# Patient Record
Sex: Male | Born: 1940 | Race: White | Hispanic: No | Marital: Married | Smoking: Never smoker
Health system: Southern US, Community
[De-identification: ages and names within clinical notes are randomized; demographics above are authoritative.]

## PROBLEM LIST (undated history)

## (undated) DIAGNOSIS — I4891 Unspecified atrial fibrillation: Secondary | ICD-10-CM

## (undated) DIAGNOSIS — I10 Essential (primary) hypertension: Secondary | ICD-10-CM

---

## 2016-09-20 ENCOUNTER — Inpatient Hospital Stay (HOSPITAL_COMMUNITY)
Admission: EM | Admit: 2016-09-20 | Discharge: 2016-09-25 | DRG: 871 | Disposition: A | Discharge status: Home / Self Care | Payer: PRIVATE HEALTH INSURANCE | Attending: Nephrology | Admitting: Nephrology

## 2016-09-20 ENCOUNTER — Encounter (HOSPITAL_COMMUNITY): Payer: Self-pay | Admitting: Emergency Medicine

## 2016-09-20 ENCOUNTER — Emergency Department (HOSPITAL_COMMUNITY): Payer: PRIVATE HEALTH INSURANCE

## 2016-09-20 DIAGNOSIS — I480 Paroxysmal atrial fibrillation: Secondary | ICD-10-CM | POA: Diagnosis present

## 2016-09-20 DIAGNOSIS — N179 Acute kidney failure, unspecified: Secondary | ICD-10-CM

## 2016-09-20 DIAGNOSIS — I10 Essential (primary) hypertension: Secondary | ICD-10-CM | POA: Diagnosis present

## 2016-09-20 DIAGNOSIS — J189 Pneumonia, unspecified organism: Secondary | ICD-10-CM | POA: Diagnosis present

## 2016-09-20 DIAGNOSIS — R531 Weakness: Secondary | ICD-10-CM | POA: Diagnosis present

## 2016-09-20 DIAGNOSIS — E860 Dehydration: Secondary | ICD-10-CM | POA: Diagnosis present

## 2016-09-20 DIAGNOSIS — M6282 Rhabdomyolysis: Secondary | ICD-10-CM | POA: Diagnosis present

## 2016-09-20 DIAGNOSIS — Z79899 Other long term (current) drug therapy: Secondary | ICD-10-CM | POA: Diagnosis not present

## 2016-09-20 DIAGNOSIS — J9601 Acute respiratory failure with hypoxia: Secondary | ICD-10-CM | POA: Diagnosis not present

## 2016-09-20 DIAGNOSIS — Z09 Encounter for follow-up examination after completed treatment for conditions other than malignant neoplasm: Secondary | ICD-10-CM

## 2016-09-20 DIAGNOSIS — W1830XA Fall on same level, unspecified, initial encounter: Secondary | ICD-10-CM | POA: Diagnosis present

## 2016-09-20 DIAGNOSIS — A419 Sepsis, unspecified organism: Principal | ICD-10-CM | POA: Diagnosis present

## 2016-09-20 DIAGNOSIS — J181 Lobar pneumonia, unspecified organism: Secondary | ICD-10-CM | POA: Diagnosis not present

## 2016-09-20 DIAGNOSIS — R0602 Shortness of breath: Secondary | ICD-10-CM

## 2016-09-20 HISTORY — DX: Essential (primary) hypertension: I10

## 2016-09-20 HISTORY — DX: Unspecified atrial fibrillation: I48.91

## 2016-09-20 LAB — CBC WITH DIFFERENTIAL/PLATELET
Basophils Absolute: 0 10*3/uL (ref 0.0–0.1)
Basophils Relative: 0 %
EOS ABS: 0 10*3/uL (ref 0.0–0.7)
Eosinophils Relative: 0 %
HCT: 37.8 % — ABNORMAL LOW (ref 39.0–52.0)
HEMOGLOBIN: 13.5 g/dL (ref 13.0–17.0)
LYMPHS ABS: 0.4 10*3/uL — AB (ref 0.7–4.0)
Lymphocytes Relative: 2 %
MCH: 31.5 pg (ref 26.0–34.0)
MCHC: 35.7 g/dL (ref 30.0–36.0)
MCV: 88.1 fL (ref 78.0–100.0)
MONOS PCT: 5 %
Monocytes Absolute: 1 10*3/uL (ref 0.1–1.0)
NEUTROS ABS: 19.6 10*3/uL — AB (ref 1.7–7.7)
NEUTROS PCT: 93 %
Platelets: 224 10*3/uL (ref 150–400)
RBC: 4.29 MIL/uL (ref 4.22–5.81)
RDW: 13.4 % (ref 11.5–15.5)
WBC: 21 10*3/uL — AB (ref 4.0–10.5)

## 2016-09-20 LAB — COMPREHENSIVE METABOLIC PANEL
ALT: 41 U/L (ref 17–63)
ANION GAP: 8 (ref 5–15)
AST: 77 U/L — ABNORMAL HIGH (ref 15–41)
Albumin: 2.9 g/dL — ABNORMAL LOW (ref 3.5–5.0)
Alkaline Phosphatase: 76 U/L (ref 38–126)
BUN: 44 mg/dL — ABNORMAL HIGH (ref 6–20)
CHLORIDE: 104 mmol/L (ref 101–111)
CO2: 22 mmol/L (ref 22–32)
Calcium: 8.1 mg/dL — ABNORMAL LOW (ref 8.9–10.3)
Creatinine, Ser: 1.3 mg/dL — ABNORMAL HIGH (ref 0.61–1.24)
GFR, EST NON AFRICAN AMERICAN: 52 mL/min — AB (ref >60)
Glucose, Bld: 155 mg/dL — ABNORMAL HIGH (ref 65–99)
POTASSIUM: 4.3 mmol/L (ref 3.5–5.1)
SODIUM: 134 mmol/L — AB (ref 135–145)
Total Bilirubin: 1 mg/dL (ref 0.3–1.2)
Total Protein: 6.3 g/dL — ABNORMAL LOW (ref 6.5–8.1)

## 2016-09-20 LAB — URINALYSIS, ROUTINE W REFLEX MICROSCOPIC
Bilirubin Urine: NEGATIVE
GLUCOSE, UA: NEGATIVE mg/dL
KETONES UR: NEGATIVE mg/dL
Leukocytes, UA: NEGATIVE
NITRITE: NEGATIVE
PROTEIN: 30 mg/dL — AB
Specific Gravity, Urine: 1.019 (ref 1.005–1.030)
pH: 5 (ref 5.0–8.0)

## 2016-09-20 LAB — INFLUENZA PANEL BY PCR (TYPE A & B)
INFLAPCR: NEGATIVE
Influenza B By PCR: NEGATIVE

## 2016-09-20 LAB — I-STAT CG4 LACTIC ACID, ED: LACTIC ACID, VENOUS: 1.71 mmol/L (ref 0.5–1.9)

## 2016-09-20 MED ORDER — AMLODIPINE BESYLATE 5 MG PO TABS
5.0000 mg | ORAL_TABLET | Freq: Every day | ORAL | Status: DC
  Administered 2016-09-20 – 2016-09-24 (×5): 5 mg via ORAL
  Filled 2016-09-20 (×5): qty 1

## 2016-09-20 MED ORDER — SODIUM CHLORIDE 0.9 % IV SOLN
INTRAVENOUS | Status: AC
  Administered 2016-09-20 – 2016-09-21 (×2): via INTRAVENOUS

## 2016-09-20 MED ORDER — SODIUM CHLORIDE 0.9 % IV BOLUS (SEPSIS)
1000.0000 mL | Freq: Once | INTRAVENOUS | Status: AC
  Administered 2016-09-20: 1000 mL via INTRAVENOUS

## 2016-09-20 MED ORDER — DEXTROSE 5 % IV SOLN
1.0000 g | Freq: Once | INTRAVENOUS | Status: AC
  Administered 2016-09-20: 1 g via INTRAVENOUS
  Filled 2016-09-20: qty 10

## 2016-09-20 MED ORDER — AZITHROMYCIN 250 MG PO TABS
500.0000 mg | ORAL_TABLET | ORAL | Status: DC
  Administered 2016-09-21 – 2016-09-25 (×5): 500 mg via ORAL
  Filled 2016-09-20 (×5): qty 2

## 2016-09-20 MED ORDER — ZOLPIDEM TARTRATE 5 MG PO TABS
5.0000 mg | ORAL_TABLET | Freq: Once | ORAL | Status: AC
Start: 2016-09-20 — End: 2016-09-20
  Administered 2016-09-20: 5 mg via ORAL
  Filled 2016-09-20: qty 1

## 2016-09-20 MED ORDER — DEXTROSE 5 % IV SOLN
1.0000 g | INTRAVENOUS | Status: DC
  Administered 2016-09-21 – 2016-09-25 (×5): 1 g via INTRAVENOUS
  Filled 2016-09-20 (×5): qty 10

## 2016-09-20 MED ORDER — APIXABAN 5 MG PO TABS
5.0000 mg | ORAL_TABLET | Freq: Two times a day (BID) | ORAL | Status: DC
  Administered 2016-09-20 – 2016-09-25 (×10): 5 mg via ORAL
  Filled 2016-09-20 (×10): qty 1

## 2016-09-20 MED ORDER — AZITHROMYCIN 500 MG IV SOLR
500.0000 mg | Freq: Once | INTRAVENOUS | Status: AC
  Administered 2016-09-20: 500 mg via INTRAVENOUS
  Filled 2016-09-20: qty 500

## 2016-09-20 NOTE — Progress Notes (Signed)
Pt reports inability to sleep for 2 days and requests something to help sleep.  Pt does not take any medications at home for this nor does pt normally have any trouble sleeping.  Paged on call coverage to notify.

## 2016-09-20 NOTE — ED Provider Notes (Signed)
[facility]-EMERGENCY DEPT Provider Note   CSN: [id number] Arrival date & time: 09/20/16  1341     History   Chief Complaint Chief Complaint  Patient presents with  . Weakness  . Dizziness  . Fever    HPI Randy Tucker is a 75 y.o. male.  Patient with history of atrial fibrillation -- presents with complaint of intermittent fevers, weakness, shivering chills occurring over the past 5 days. Symptoms started 1 day prior [place] Patient went to an outside urgent care and had a negative flu swab 2 days ago. This morning when the patient awoke he felt very weak and fell to the ground. He was unable to get himself off the ground. He had fecal and urinary incontinence. He seems to remember the events. Otherwise no URI symptoms, significant cough, vomiting, diarrhea. No urinary sx. 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 The onset of this condition was acute. The course is constant. Aggravating factors: none. Alleviating factors: none.        Past Medical History:  Diagnosis Date  . Atrial fibrillation (HCC)   . Hypertension     There are no active problems to display for this patient.   History reviewed. No pertinent surgical history.     Home Medications    Prior to Admission medications   Not on File    Family History No family history on file.  Social History Social History  Substance Use Topics  . Smoking status: Never Smoker  . Smokeless tobacco: Never Used  . Alcohol use Not on file     Allergies   Patient has no allergy information on record.   Review of Systems Review of Systems  Constitutional: Positive for chills and fever.  HENT: Negative for rhinorrhea and sore throat.   Eyes: Negative for redness.  Respiratory: Negative for cough.   Cardiovascular: Negative for chest pain.  Gastrointestinal: Negative for abdominal pain, diarrhea, nausea and vomiting.  Genitourinary: Negative for dysuria.    Musculoskeletal: Negative for myalgias.  Skin: Negative for rash.  Neurological: Positive for weakness. Negative for headaches.     Physical Exam Updated Vital Signs BP 131/69 (BP Location: Left Arm)   Pulse 113   Temp 100 F (37.8 C) (Oral)   Resp 18   Ht 5\' 10"  (1.778 m)   Wt 86.2 kg   SpO2 91%   BMI 27.26 kg/m   Physical Exam  Constitutional: He appears well-developed and well-nourished.  HENT:  Head: Normocephalic and atraumatic.  Mouth/Throat: Oropharynx is clear and moist.  Eyes: Conjunctivae are normal. Right eye exhibits no discharge. Left eye exhibits no discharge.  Neck: Normal range of motion. Neck supple.  Cardiovascular: Normal rate, regular rhythm and normal heart sounds.   Pulmonary/Chest: Effort normal. He has rales in the right middle field and the left middle field.  Abdominal: Soft. He exhibits no mass. There is no tenderness. There is no guarding.  Neurological: He is alert.  Skin: Skin is warm and dry.  Psychiatric: He has a normal mood and affect.  Nursing note and vitals reviewed.    ED Treatments / Results  Labs (all labs ordered are listed, but only abnormal results are displayed) Labs Reviewed  COMPREHENSIVE METABOLIC PANEL - Abnormal; Notable for the following:       Result Value   Sodium 134 (*)    Glucose, Bld 155 (*)    BUN 44 (*)    Creatinine, Ser 1.30 (*)  Calcium 8.1 (*)    Total Protein 6.3 (*)    Albumin 2.9 (*)    AST 77 (*)    GFR calc non Af Amer 52 (*)    All other components within normal limits  CBC WITH DIFFERENTIAL/PLATELET - Abnormal; Notable for the following:    WBC 21.0 (*)    HCT 37.8 (*)    Neutro Abs 19.6 (*)    Lymphs Abs 0.4 (*)    All other components within normal limits  CULTURE, BLOOD (ROUTINE X 2)  CULTURE, BLOOD (ROUTINE X 2)  URINE CULTURE  URINALYSIS, ROUTINE W REFLEX MICROSCOPIC  I-STAT CG4 LACTIC ACID, ED    ED ECG REPORT   Date: 09/20/2016  Rate: 109  Rhythm: sinus tachycardia   QRS Axis: normal  Intervals: normal  ST/T Wave abnormalities: normal  Conduction Disutrbances:none  Narrative Interpretation:   Old EKG Reviewed: none available  I have personally reviewed the EKG tracing and agree with the computerized printout as noted.  Radiology Dg Chest 2 View  Result Date: 09/20/2016 CLINICAL DATA:  Chills, weakness for 3 days.  Fever. EXAM: CHEST  2 VIEW COMPARISON:  None. FINDINGS: Mild cardiomegaly. There is consolidation in the right upper lobe compatible with pneumonia. No confluent opacity on the left. No effusion or acute bony abnormality. IMPRESSION: Right upper lobe pneumonia. Followup PA and lateral chest X-ray is recommended in 3-4 weeks following trial of antibiotic therapy to ensure resolution and exclude underlying malignancy. Electronically Signed   By: Charlett NoseKevin  Dover M.D.   On: 09/20/2016 14:43    Procedures Procedures (including critical care time)  Medications Ordered in ED Medications  sodium chloride 0.9 % bolus 1,000 mL (1,000 mLs Intravenous New Bag/Given 09/20/16 1432)  cefTRIAXone (ROCEPHIN) 1 g in dextrose 5 % 50 mL IVPB (not administered)  azithromycin (ZITHROMAX) 500 mg in dextrose 5 % 250 mL IVPB (not administered)     Initial Impression / Assessment and Plan / ED Course  I have reviewed the triage vital signs and the nursing notes.  Pertinent labs & imaging results that were available during my care of the patient were reviewed by me and considered in my medical decision making (see chart for details).  Clinical Course    Patient seen and examined. Work-up initiated. Medications ordered. Fluids ordered. Lactate is normal. Will not give 30cc/kg bolus. Patient appears well. Cultures ordered.   Vital signs reviewed and are as follows: BP 131/69 (BP Location: Left Arm)   Pulse 113   Temp 100 F (37.8 C) (Oral)   Resp 18   Ht 5\' 10"  (1.778 m)   Wt 86.2 kg   SpO2 91%   BMI 27.26 kg/m   3:24 PM Spoke with Dr. Mal MistyNetty who will  admit. Patient discussed previously with Dr. Fredderick PhenixBelfi.   Final Clinical Impressions(s) / ED Diagnoses   Final diagnoses:  Community acquired pneumonia of right upper lobe of lung (HCC)   Admit.   New Prescriptions New Prescriptions   No medications on file     Renne CriglerJoshua Shaniya Tashiro, PA-C 09/20/16 1525    Rolan BuccoMelanie Belfi, MD 09/21/16 [phone]

## 2016-09-20 NOTE — ED Notes (Signed)
Bed: [id number] Expected date:  Expected time:  Means of arrival:  Comments: 75 yo weakness x4 days, fever

## 2016-09-20 NOTE — ED Triage Notes (Addendum)
Patient here from home. Reports that he is from the [place] and is here visiting son. States that he has been weak since Tuesday. Dizziness and fever on and off. Tylenol with no relief. CBG 145

## 2016-09-20 NOTE — H&P (Signed)
History and Physical    Randy AdeDonald Kapusta [id number] DOB: 1941/08/08 DOA: 09/20/2016  PCP: No primary care provider on file. Patient coming from: Home  Chief Complaint: Weakness  HPI: Randy Tucker is a 75 y.o. male with medical history significant of paroxysmal atrial fibrillation and hypertension. Symptoms started 3 days ago with chills. 2 days ago, he went to the urgent care center and was told he likely had a virus. He was told to take Tylenol which has not helped with the symptoms. This morning while trying to go to the restroom, his legs gave out from under him and he was not able to get up. He did not hit his head and did not pass out. At that point, after lying there for a few hours, he was transferred to the emergency for evaluation.  ED Course: Vitals: Tmax of 100 degrees farenheit. Normotensive. On room air. Labs: Sodium 134. Creatinine 1.30. WBC 21K. Urine hemoglobin positive without RBCs Imaging: CXR shows right upper lobe pneumonia Medications/Course: Ceftriaxone and azithromycin  Review of Systems: Review of Systems  Constitutional: Positive for chills and fever (subjective).  Respiratory: Positive for cough. Negative for sputum production, shortness of breath and wheezing.   Cardiovascular: Negative for chest pain.  Gastrointestinal: Negative for abdominal pain, constipation, diarrhea, nausea and vomiting.  All other systems reviewed and are negative.   Past Medical History:  Diagnosis Date  . Atrial fibrillation (HCC)   . Hypertension     History reviewed. No pertinent surgical history.   reports that he has never smoked. He has never used smokeless tobacco. His alcohol and drug histories are not on file.  No Known Allergies  Family History  Problem Relation Age of Onset  . Lung cancer Father     Prior to Admission medications   Medication Sig Start Date End Date Taking? Authorizing Provider  acetaminophen (TYLENOL) 325 MG tablet Take 650 mg by mouth  every 4 (four) hours as needed.   Yes Historical Provider, MD  amLODipine (NORVASC) 5 MG tablet Take 5 mg by mouth daily.   Yes Historical Provider, MD  apixaban (ELIQUIS) 5 MG TABS tablet Take 5 mg by mouth 2 (two) times daily.   Yes Historical Provider, MD    Physical Exam: Vitals:   09/20/16 1349 09/20/16 1506 09/20/16 1720  BP: 131/69 137/78 115/72  Pulse: 113 108 (!) 101  Resp: 18 18 20   Temp: 100 F (37.8 C)  99 F (37.2 C)  TempSrc: Oral  Oral  SpO2: 91% 92% 93%  Weight: 86.2 kg (190 lb)  85.6 kg (188 lb 11.4 oz)  Height: 5\' 10"  (1.778 m)  5\' 10"  (1.778 m)     Constitutional: NAD, calm, comfortable Eyes: PERRL, lids and conjunctivae normal ENMT: Mucous membranes are moist. Posterior pharynx clear of any exudate or lesions. Neck: normal, supple, no masses, no thyromegaly Respiratory: Diffuse crackles with diminished breath sounds in right upper lobe. Normal respiratory effort. No accessory muscle use.  Cardiovascular: Regular rate and rhythm, no murmurs / rubs / gallops. No extremity edema. 2+ pedal pulses. Abdomen: no tenderness, no masses palpated. No hepatosplenomegaly. Bowel sounds positive.  Musculoskeletal: no clubbing / cyanosis. No joint deformity upper and lower extremities. Good ROM, no contractures. Normal muscle tone.  Skin: no rashes, lesions, ulcers. No induration Neurologic: CN 2-12 grossly intact. Sensation intact, DTR normal. Strength 5/5 in all 4.  Psychiatric: Normal judgment and insight. Alert and oriented x 3. Normal mood.    Labs on Admission: I have  personally reviewed following labs and imaging studies  CBC:  Recent Labs Lab 09/20/16 1420  WBC 21.0*  NEUTROABS 19.6*  HGB 13.5  HCT 37.8*  MCV 88.1  PLT 224   Basic Metabolic Panel:  Recent Labs Lab 09/20/16 1420  NA 134*  K 4.3  CL 104  CO2 22  GLUCOSE 155*  BUN 44*  CREATININE 1.30*  CALCIUM 8.1*   GFR: Estimated Creatinine Clearance: 50.7 mL/min (by C-G formula based on SCr  of 1.3 mg/dL (H)). Liver Function Tests:  Recent Labs Lab 09/20/16 1420  AST 77*  ALT 41  ALKPHOS 76  BILITOT 1.0  PROT 6.3*  ALBUMIN 2.9*   No results for input(s): LIPASE, AMYLASE in the last 168 hours. No results for input(s): AMMONIA in the last 168 hours. Coagulation Profile: No results for input(s): INR, PROTIME in the last 168 hours. Cardiac Enzymes: No results for input(s): CKTOTAL, CKMB, CKMBINDEX, TROPONINI in the last 168 hours. BNP (last 3 results) No results for input(s): PROBNP in the last 8760 hours. HbA1C: No results for input(s): HGBA1C in the last 72 hours. CBG: No results for input(s): GLUCAP in the last 168 hours. Lipid Profile: No results for input(s): CHOL, HDL, LDLCALC, TRIG, CHOLHDL, LDLDIRECT in the last 72 hours. Thyroid Function Tests: No results for input(s): TSH, T4TOTAL, FREET4, T3FREE, THYROIDAB in the last 72 hours. Anemia Panel: No results for input(s): VITAMINB12, FOLATE, FERRITIN, TIBC, IRON, RETICCTPCT in the last 72 hours. Urine analysis:    Component Value Date/Time   COLORURINE YELLOW 09/20/2016 1502   APPEARANCEUR HAZY (A) 09/20/2016 1502   LABSPEC 1.019 09/20/2016 1502   PHURINE 5.0 09/20/2016 1502   GLUCOSEU NEGATIVE 09/20/2016 1502   HGBUR LARGE (A) 09/20/2016 1502   BILIRUBINUR NEGATIVE 09/20/2016 1502   KETONESUR NEGATIVE 09/20/2016 1502   PROTEINUR 30 (A) 09/20/2016 1502   NITRITE NEGATIVE 09/20/2016 1502   LEUKOCYTESUR NEGATIVE 09/20/2016 1502    Radiological Exams on Admission: Dg Chest 2 View  Result Date: 09/20/2016 CLINICAL DATA:  Chills, weakness for 3 days.  Fever. EXAM: CHEST  2 VIEW COMPARISON:  None. FINDINGS: Mild cardiomegaly. There is consolidation in the right upper lobe compatible with pneumonia. No confluent opacity on the left. No effusion or acute bony abnormality. IMPRESSION: Right upper lobe pneumonia. Followup PA and lateral chest X-ray is recommended in 3-4 weeks following trial of antibiotic  therapy to ensure resolution and exclude underlying malignancy. Electronically Signed   By: Charlett NoseKevin  Dover M.D.   On: 09/20/2016 14:43    EKG: Independently reviewed. Sinus tachycardia  Assessment/Plan Principal Problem:   Community acquired pneumonia Active Problems:   Paroxysmal atrial fibrillation (HCC)   Essential hypertension  Community acquired pneumonia -ceftriaxone and azithromycin -sputum culture/gram stain -urine strep -blood cultures pending -repeat CXR in 3-4 weeks  Acute kidney injury Likely secondary to dehydration. Patient also down for some time with likely mild rhabdomyolysis -IV fluids -recheck BMP in AM  Paroxysmal atrial fibrillation Rate controlled. -continue Eliquis  Essential hypertension -continue amlodipine   DVT prophylaxis: Eliquis Code Status: Full code Family Communication: Wife at bedside Disposition Plan: Anticipate discharge home in 2 days Consults called: None Admission status: Inpatient, medical floor   Jacquelin Hawkingalph Nettey, MD [facility] Pager [phone]  If 7PM-7AM, please contact night-coverage [email or web address] Password [email or web address]  09/20/2016, 5:46 PM

## 2016-09-21 ENCOUNTER — Inpatient Hospital Stay (HOSPITAL_COMMUNITY): Payer: PRIVATE HEALTH INSURANCE

## 2016-09-21 LAB — BASIC METABOLIC PANEL
Anion gap: 7 (ref 5–15)
BUN: 32 mg/dL — AB (ref 6–20)
CO2: 20 mmol/L — ABNORMAL LOW (ref 22–32)
CREATININE: 1.02 mg/dL (ref 0.61–1.24)
Calcium: 7.5 mg/dL — ABNORMAL LOW (ref 8.9–10.3)
Chloride: 107 mmol/L (ref 101–111)
GFR calc Af Amer: >60 mL/min (ref >60)
Glucose, Bld: 121 mg/dL — ABNORMAL HIGH (ref 65–99)
POTASSIUM: 4 mmol/L (ref 3.5–5.1)
SODIUM: 134 mmol/L — AB (ref 135–145)

## 2016-09-21 LAB — CBC
HEMATOCRIT: 35.7 % — AB (ref 39.0–52.0)
Hemoglobin: 12.2 g/dL — ABNORMAL LOW (ref 13.0–17.0)
MCH: 31 pg (ref 26.0–34.0)
MCHC: 34.2 g/dL (ref 30.0–36.0)
MCV: 90.6 fL (ref 78.0–100.0)
PLATELETS: 229 10*3/uL (ref 150–400)
RBC: 3.94 MIL/uL — ABNORMAL LOW (ref 4.22–5.81)
RDW: 13.9 % (ref 11.5–15.5)
WBC: 18.5 10*3/uL — AB (ref 4.0–10.5)

## 2016-09-21 LAB — URINE CULTURE: Culture: NO GROWTH

## 2016-09-21 LAB — EXPECTORATED SPUTUM ASSESSMENT W GRAM STAIN, RFLX TO RESP C

## 2016-09-21 LAB — HIV ANTIBODY (ROUTINE TESTING W REFLEX): HIV Screen 4th Generation wRfx: NONREACTIVE

## 2016-09-21 LAB — STREP PNEUMONIAE URINARY ANTIGEN: STREP PNEUMO URINARY ANTIGEN: NEGATIVE

## 2016-09-21 LAB — LACTIC ACID, PLASMA: Lactic Acid, Venous: 0.8 mmol/L (ref 0.5–1.9)

## 2016-09-21 MED ORDER — SODIUM CHLORIDE 0.9 % IV BOLUS (SEPSIS): 500.0000 mL | Freq: Once | INTRAVENOUS | Status: DC

## 2016-09-21 MED ORDER — ACETAMINOPHEN 325 MG PO TABS
650.0000 mg | ORAL_TABLET | ORAL | Status: DC | PRN
  Administered 2016-09-21 – 2016-09-23 (×2): 650 mg via ORAL
  Filled 2016-09-21: qty 2

## 2016-09-21 MED ORDER — IBUPROFEN 200 MG PO TABS: 400.0000 mg | ORAL_TABLET | Freq: Once | ORAL | Status: DC

## 2016-09-21 MED ORDER — SODIUM CHLORIDE 0.9 % IV BOLUS (SEPSIS)
1000.0000 mL | Freq: Once | INTRAVENOUS | Status: AC
  Administered 2016-09-21: 1000 mL via INTRAVENOUS

## 2016-09-21 MED ORDER — IPRATROPIUM-ALBUTEROL 0.5-2.5 (3) MG/3ML IN SOLN
3.0000 mL | Freq: Three times a day (TID) | RESPIRATORY_TRACT | Status: DC
Start: 2016-09-21 — End: 2016-09-25
  Administered 2016-09-22 – 2016-09-25 (×11): 3 mL via RESPIRATORY_TRACT
  Filled 2016-09-21 (×11): qty 3

## 2016-09-21 MED ORDER — LORAZEPAM 1 MG PO TABS
1.0000 mg | ORAL_TABLET | Freq: Two times a day (BID) | ORAL | Status: DC | PRN
  Administered 2016-09-21 – 2016-09-24 (×4): 1 mg via ORAL
  Filled 2016-09-21 (×4): qty 1

## 2016-09-21 MED ORDER — IPRATROPIUM-ALBUTEROL 0.5-2.5 (3) MG/3ML IN SOLN
3.0000 mL | RESPIRATORY_TRACT | Status: DC | PRN
  Administered 2016-09-21 – 2016-09-25 (×2): 3 mL via RESPIRATORY_TRACT
  Filled 2016-09-21 (×2): qty 3

## 2016-09-21 MED ORDER — SODIUM CHLORIDE 0.9 % IV SOLN
INTRAVENOUS | Status: AC
  Administered 2016-09-21: 19:00:00 via INTRAVENOUS

## 2016-09-21 NOTE — Progress Notes (Signed)
Report called and given to RN on 4W.

## 2016-09-21 NOTE — Progress Notes (Signed)
Mid level paged concerning patients respirations of 40, temp 102.1 Axillary, HR 95, b/p 122/70.  Rapid response team called also to evaluate patients condition and if he would need a higher level of care to be monitored more closely. Will continue to monitor.

## 2016-09-21 NOTE — Progress Notes (Signed)
Wrong time entered for vital signs. -MR Nt1 3W

## 2016-09-21 NOTE — Progress Notes (Signed)
PT demonstrated verbal and hands on understanding of Flutter device. 

## 2016-09-21 NOTE — Progress Notes (Signed)
Dr Gwenlyn PerkingMadera paged concerning increased respirations of 36. Patient alert and oriented  X 4, no respiratory distress, pulse 98. New orders to be placed for chest xray. Will continue to monitor.

## 2016-09-21 NOTE — Progress Notes (Signed)
[facility] PROGRESS NOTE  Randy AdeDonald Tucker [id number] DOB: 07-Dec-1940 DOA: 09/20/2016 PCP: No primary care provider on file.  Interim summary and HPI 75 y.o. male with medical history significant of paroxysmal atrial fibrillation and hypertension. Symptoms started 3 days ago with chills. 2 days ago, he went to the urgent care center and was told he likely had a virus. He was told to take Tylenol which has not helped with the symptoms. This morning while trying to go to the restroom, his legs gave out from under him and he was not able to get up. He did not hit his head and did not pass out. At that point, after lying there for a few hours, he was transferred to the emergency for evaluation.  Assessment/Plan: 1-SEpsis due to CAP: patient febrile, tachycardic, with elevated RR and AKI -patient with CURB 65 score > 2 -slowly improving and feeling better -no febrile now, but still SOB and with tachypnea (RR in 30's) -will continue IV abx's, flutter valve/IS and nebulizer treatment -will follow cx's as per PNA protocol  -continue supportive care  2-AKI: in presence of sepsis and dehydration  -will continue IVF's -Cr has trended down -will minimize nephrotoxic agents  3-essential HTN -will continue amlodipine -BP is stable  4-Paroxysmal A. Fib -CHADsVASC score 3 -rate controlled currently -will continue Eliquis for secondary prevention   Code Status: Full Family Communication: no family at bedside  Disposition Plan: continue IV antibiotics, gentle IVF's, follow BMET and WBC's trend; continue nebulizer therapy and flutter valve/IS.   Consultants:  None   Procedures:  See below for x-ray reports   Antibiotics:  Rocephin and Zithromax 12/16  HPI/Subjective: Afebrile, still SOB with exertion and with positive tachypnea. No CP.  Objective: Vitals:   09/21/16 0415 09/21/16 0943  BP: (!) 110/55   Pulse: 80   Resp: 16 (!) 32  Temp: 98.2 F (36.8 C)      Intake/Output Summary (Last 24 hours) at 09/21/16 1017 Last data filed at 09/21/16 0415  Gross per 24 hour  Intake          1496.67 ml  Output              450 ml  Net          1046.67 ml   Filed Weights   09/20/16 1349 09/20/16 1720  Weight: 86.2 kg (190 lb) 85.6 kg (188 lb 11.4 oz)    Exam:   General:  Afebrile currently, feeling slightly better; still SOB with minimal exertion and with elevated RR. No nausea, no vomiting.  Cardiovascular: S1 and s2, no rubs or gallops   Respiratory: scattered rhonchi, decrease air movement and with positive tachypnea  Abdomen: soft, NT, positive BS  Musculoskeletal: no edema or cyanosis   Data Reviewed: Basic Metabolic Panel:  Recent Labs Lab 09/20/16 1420 09/21/16 0346  NA 134* 134*  K 4.3 4.0  CL 104 107  CO2 22 20*  GLUCOSE 155* 121*  BUN 44* 32*  CREATININE 1.30* 1.02  CALCIUM 8.1* 7.5*   Liver Function Tests:  Recent Labs Lab 09/20/16 1420  AST 77*  ALT 41  ALKPHOS 76  BILITOT 1.0  PROT 6.3*  ALBUMIN 2.9*   CBC:  Recent Labs Lab 09/20/16 1420 09/21/16 0346  WBC 21.0* 18.5*  NEUTROABS 19.6*  --   HGB 13.5 12.2*  HCT 37.8* 35.7*  MCV 88.1 90.6  PLT 224 229   Studies: Dg Chest 2 View  Result Date: 09/20/2016 CLINICAL DATA:  Chills, weakness for 3 days.  Fever. EXAM: CHEST  2 VIEW COMPARISON:  None. FINDINGS: Mild cardiomegaly. There is consolidation in the right upper lobe compatible with pneumonia. No confluent opacity on the left. No effusion or acute bony abnormality. IMPRESSION: Right upper lobe pneumonia. Followup PA and lateral chest X-ray is recommended in 3-4 weeks following trial of antibiotic therapy to ensure resolution and exclude underlying malignancy. Electronically Signed   By: Charlett NoseKevin  Dover M.D.   On: 09/20/2016 14:43    Scheduled Meds: . amLODipine  5 mg Oral Daily  . apixaban  5 mg Oral BID  . azithromycin  500 mg Oral Q24H  . cefTRIAXone (ROCEPHIN)  IV  1 g Intravenous Q24H    Continuous Infusions: . sodium chloride 50 mL/hr at 09/21/16 [id number]    Principal Problem:   Community acquired pneumonia Active Problems:   Paroxysmal atrial fibrillation (HCC)   Essential hypertension   Acute kidney injury (HCC)    Time spent: 25 minutes    Vassie LollMadera, Champayne Kocian  [facility] Pager [phone]. If 7PM-7AM, please contact night-coverage at [email or web address], password [email or web address] 09/21/2016, 10:17 AM  LOS: 1 day

## 2016-09-21 NOTE — Progress Notes (Addendum)
Rapid Response Event Note  Overview:Patient resting in bed, color normal, alert and oriented x4; no respiratory distress noted with the exception of increased respirations.  Patient denies SOB currently; reports feeling occasionally SOB with exertion,  respirations ranging from 22-low 40's.  Slightly tachy with temp 102.6 axillary.  BP 136/69; Pulse 102, R 32 currently, 95% 4l via [place]      Initial Focused Assessment:Resting in bed, a/ox4; increased respirations with no acute sign of respiratory distress, patient denies SOB.  Elevated temp 102.6 axillary; slightly tachy at 102; BP normal 136/69.  Patient starting trending with increased respirations around 0935 this am.  Respirations ranging from 22-low 40's.     Interventions:O2 was applied by primary RN, NS bolus given, order for breathing treatment and tylenol given.    Plan of Care (if not transferred):awaiting evaluation by NP;   Event Summary:2040-patients HR increased to 130; obtaining 12 lead EKG    at  2049- HR 102; BP 131/70; 96% 4L via [place]; R 30    at          [employer], Donnetta HailLaurie Leigh

## 2016-09-22 DIAGNOSIS — J9621 Acute and chronic respiratory failure with hypoxia: Secondary | ICD-10-CM

## 2016-09-22 LAB — BASIC METABOLIC PANEL
Anion gap: 7 (ref 5–15)
BUN: 29 mg/dL — ABNORMAL HIGH (ref 6–20)
CO2: 22 mmol/L (ref 22–32)
Calcium: 7.4 mg/dL — ABNORMAL LOW (ref 8.9–10.3)
Chloride: 110 mmol/L (ref 101–111)
Creatinine, Ser: 0.93 mg/dL (ref 0.61–1.24)
GFR calc Af Amer: >60 mL/min (ref >60)
GFR calc non Af Amer: >60 mL/min (ref >60)
Glucose, Bld: 137 mg/dL — ABNORMAL HIGH (ref 65–99)
Potassium: 4 mmol/L (ref 3.5–5.1)
Sodium: 139 mmol/L (ref 135–145)

## 2016-09-22 LAB — CBC
HCT: 34 % — ABNORMAL LOW (ref 39.0–52.0)
Hemoglobin: 11.4 g/dL — ABNORMAL LOW (ref 13.0–17.0)
MCH: 30.8 pg (ref 26.0–34.0)
MCHC: 33.5 g/dL (ref 30.0–36.0)
MCV: 91.9 fL (ref 78.0–100.0)
Platelets: 245 10*3/uL (ref 150–400)
RBC: 3.7 MIL/uL — ABNORMAL LOW (ref 4.22–5.81)
RDW: 14.1 % (ref 11.5–15.5)
WBC: 9.7 10*3/uL (ref 4.0–10.5)

## 2016-09-22 MED ORDER — BUDESONIDE 0.25 MG/2ML IN SUSP
0.2500 mg | Freq: Two times a day (BID) | RESPIRATORY_TRACT | Status: DC
  Administered 2016-09-22 – 2016-09-25 (×7): 0.25 mg via RESPIRATORY_TRACT
  Filled 2016-09-22 (×7): qty 2

## 2016-09-22 NOTE — Progress Notes (Signed)
[facility] PROGRESS NOTE  Randy Tucker ZOX:[id number] DOB: [birth year] DOA: 09/20/2016 PCP: No primary care provider on file.  Interim summary and HPI 75 y.o. male with medical history significant of paroxysmal atrial fibrillation and hypertension. Symptoms started 3 days ago with chills. 2 days ago, he went to the urgent care center and was told he likely had a virus. He was told to take Tylenol which has not helped with the symptoms. This morning while trying to go to the restroom, his legs gave out from under him and he was not able to get up. He did not hit his head and did not pass out. At that point, after lying there for a few hours, he was transferred to the emergency for evaluation.  Assessment/Plan: 1-Sepsis due to CAP: patient febrile, tachycardic, with elevated RR and AKI. Also requiring Oxygen supplementation. -patient with CURB 65 score > 2 -slowly improving and feeling better -no febrile now, but still SOB and with tachypnea (RR in 30's) -will continue IV abx's, flutter valve/IS and nebulizer treatment; adding pulmicort BID -will follow cx's as per PNA protocol  -Continue supportive care  2-acute resp failure with hypoxia -due to #1 -continue treatment as above -will follow clinical response and weaned O2 supplementation as tolerated  3-AKI: in presence of sepsis and dehydration  -Will continue IVF's -Cr has trended down -Will minimize nephrotoxic agents  4-essential HTN -will continue amlodipine -BP is stable  5-Paroxysmal A. Fib -CHADsVASC score 3 -rate controlled currently -will continue Eliquis for secondary prevention  -observing him in telemetry; has remained sinus rhythm   Code Status: Full Family Communication: no family at bedside  Disposition Plan: continue IV antibiotics, follow BMET and WBC's trend; continue nebulizer therapy and flutter valve/IS.   Consultants:  None   Procedures:  See below for x-ray reports    Antibiotics:  Rocephin and Zithromax 12/16  HPI/Subjective: Afebrile, still SOB with exertion, some difficulty speaking in full sentences and with positive tachypnea. No CP and overall improved currently. Rough night with episode of acute resp failure with hypoxia  Objective: Vitals:   09/21/16 2348 09/22/16 0446  BP: (!) 113/59 133/75  Pulse: 80 90  Resp: (!) 22 (!) 39  Temp: 98.6 F (37 C) 97.7 F (36.5 C)    Intake/Output Summary (Last 24 hours) at 09/22/16 1510 Last data filed at 09/22/16 1221  Gross per 24 hour  Intake              600 ml  Output              400 ml  Net              200 ml   Filed Weights   09/20/16 1349 09/20/16 1720 09/21/16 2348  Weight: 86.2 kg (190 lb) 85.6 kg (188 lb 11.4 oz) 88 kg (193 lb 14.4 oz)    Exam:   General:  Afebrile currently, feeling better today; but still SOB and with busy night experiencing acute resp failure and hypoxia. Repeat CXR demonstrated just PNA (now with some infiltrates on the left as well); no vascular congestion or pulmonary edema. Patient slightly anxious, otherwise in no distress.   Cardiovascular: S1 and s2, no rubs or gallops   Respiratory: scattered rhonchi, decrease air movement and with positive tachypnea  Abdomen: soft, NT, positive BS  Musculoskeletal: no edema or cyanosis   Data Reviewed: Basic Metabolic Panel:  Recent Labs Lab 09/20/16 1420 09/21/16 0346 09/22/16 0338  NA 134* 134* 139  K 4.3 4.0 4.0  CL 104 107 110  CO2 22 20* 22  GLUCOSE 155* 121* 137*  BUN 44* 32* 29*  CREATININE 1.30* 1.02 0.93  CALCIUM 8.1* 7.5* 7.4*   Liver Function Tests:  Recent Labs Lab 09/20/16 1420  AST 77*  ALT 41  ALKPHOS 76  BILITOT 1.0  PROT 6.3*  ALBUMIN 2.9*   CBC:  Recent Labs Lab 09/20/16 1420 09/21/16 0346 09/22/16 0338  WBC 21.0* 18.5* 9.7  NEUTROABS 19.6*  --   --   HGB 13.5 12.2* 11.4*  HCT 37.8* 35.7* 34.0*  MCV 88.1 90.6 91.9  PLT 224 229 245   Studies: Dg Chest 2  View  Result Date: 09/21/2016 CLINICAL DATA:  SOB. Hx of HTN. EXAM: CHEST  2 VIEW COMPARISON:  09/20/2016 FINDINGS: Heart size is normal. Dense opacity identified in the right upper lobe. There are bilateral pleural effusions. Mild infiltrate identified within the left lower lobe. IMPRESSION: Persistent right upper lobe infiltrate. Question early left lower lobe infiltrate. Electronically Signed   By: Norva PavlovElizabeth  Brown M.D.   On: 09/21/2016 14:07    Scheduled Meds: . amLODipine  5 mg Oral Daily  . apixaban  5 mg Oral BID  . azithromycin  500 mg Oral Q24H  . budesonide (PULMICORT) nebulizer solution  0.25 mg Nebulization BID  . cefTRIAXone (ROCEPHIN)  IV  1 g Intravenous Q24H  . ipratropium-albuterol  3 mL Nebulization TID   Continuous Infusions:   Principal Problem:   Community acquired pneumonia Active Problems:   Paroxysmal atrial fibrillation (HCC)   Essential hypertension   Acute kidney injury (HCC)    Time spent: 25 minutes    Vassie LollMadera, Atthew Coutant  [facility] Pager [phone]. If 7PM-7AM, please contact night-coverage at [email or web address], password [email or web address] 09/22/2016, 3:10 PM  LOS: 2 days

## 2016-09-22 NOTE — Progress Notes (Signed)
Shift event note: Notified just after shift change pt w/ increased WOB w/ RR in 30's and  temp of > 102. 02 sats remain WNL on 2L [place]. Episode similar to previous episode earlier today. CXR at that time (1403) revealed new LLL infiltrate. Pt was given Ativan for anxiety which did not seem to help per RN. RR RN was paged and responded to bedside. Orders placed for 500 cc IVFB and Tylenol. RR RN reports pt somewhat improved after scheduled neb. 02 sats WNL on 2 L [place] but will drop to high 80's when 02 removed. EKG obtained revealed ST w/ rate of 102 but w/o acute changes. RR RN is concerned regarding no telemetry. Pt w/ h/o a-fib and in light of tenuous respiratory status though currently does not require SDU LOC would benefit from continuous cardiac and 02 monitoring. At bedside pt noted resting in NAD. RR 24-28 w/ 02 sats 95% on 2L [place]. BBS diminished R>L. Pt reports he is feeling better.  Assessment/Plan: 1. Tachypnea: Improved after scheduled neb. 02 sats WNL on [place] but will trend down w/o 02. CXR from today slightly worsened. Discussed pt w/ Dr Clyde LundborgNiu. Will repeat 500 cc NS IVFB. Given h/o a-fib (though remains in ST at this time), and tenuous respiratory status will transfer to telemetry for closer monitoring. Low threshold to transfer to SDU if pt deteriorates further. Will continue to monitor closely on telemetry.  Leanne ChangKatherine P. Darlynn Ricco, NP-C [facility] Pager [phone]

## 2016-09-23 LAB — CULTURE, RESPIRATORY W GRAM STAIN: Culture: NORMAL

## 2016-09-23 LAB — CULTURE, RESPIRATORY

## 2016-09-23 NOTE — Progress Notes (Signed)
[facility] PROGRESS NOTE  Randy Tucker [place]berry ZOX:[id number] DOB: [birth year] DOA: 09/20/2016 PCP: No primary care provider on file.  Interim summary and HPI 75 y.o. male with medical history significant of paroxysmal atrial fibrillation and hypertension. Symptoms started 3 days ago with chills. 2 days ago, he went to the urgent care center and was told he likely had a virus. He was told to take Tylenol which has not helped with the symptoms. This morning while trying to go to the restroom, his legs gave out from under him and he was not able to get up. He did not hit his head and did not pass out. At that point, after lying there for a few hours, he was transferred to the emergency for evaluation.  Assessment/Plan: 1-Sepsis due to CAP: patient febrile, tachycardic, with elevated RR and AKI. Also requiring Oxygen supplementation. -patient with CURB 65 score > 2 -slowly improving and feeling much better, even still requiring oxygen supplementation and experiencing tachypnea with minimal exertion. -no febrile now, for over 24 hours and WBC's WNL -will continue IV abx's for another 24 hours, continue flutter valve/IS and nebulizer/pulmicort BID therapy -will follow cx's as per PNA protocol  -Continue supportive care; patient improving  2-acute resp failure with hypoxia -due to #1 -continue treatment as above -will follow clinical response and weaned O2 supplementation as tolerated  3-AKI: in presence of sepsis and dehydration  -Will change IVF's to saline lock -Cr has trended down and is WNL now -Will minimize nephrotoxic agents -will follow trend  4-essential HTN -will continue amlodipine -BP is stable  5-Paroxysmal A. Fib -CHADsVASC score 3 -rate controlled currently -will continue Eliquis for secondary prevention  -observing him in telemetry; has remained sinus rhythm   Code Status: Full Family Communication: no family at bedside  Disposition Plan: continue IV antibiotics,  follow BMET and WBC's trend; continue nebulizer therapy and flutter valve/IS. Will attempt to weaned oxygen and if he remains stable can switch abx's to PO. Hopefully home in 1-2 days, once breathing further stabilizes.   Consultants:  None   Procedures:  See below for x-ray reports   Antibiotics:  Rocephin and Zithromax 12/16  HPI/Subjective: Afebrile, still SOB with exertion, some wheezing and positive tachypnea. Still requiring oxygen supplementation. Patient now afebrile for > 24 hours.  Objective: Vitals:   09/23/16 1639 09/23/16 2126  BP: 134/63 124/70  Pulse: 86 74  Resp: (!) 23 20  Temp: 98.4 F (36.9 C) 99.4 F (37.4 C)    Intake/Output Summary (Last 24 hours) at 09/23/16 2227 Last data filed at 09/23/16 2134  Gross per 24 hour  Intake              360 ml  Output             1650 ml  Net            -1290 ml   Filed Weights   09/20/16 1349 09/20/16 1720 09/21/16 2348  Weight: 86.2 kg (190 lb) 85.6 kg (188 lb 11.4 oz) 88 kg (193 lb 14.4 oz)    Exam:   General:  Afebrile currently, feeling better today overall. Still SOB and with needs for oxygen supplementation. No CP. Less tachypneic.  Cardiovascular: S1 and s2, no rubs or gallops   Respiratory: scattered rhonchi, mild exp wheezing and fair air movement; positive tachypnea  Abdomen: soft, NT, positive BS  Musculoskeletal: no edema or cyanosis   Data Reviewed: Basic Metabolic Panel:  Recent Labs Lab 09/20/16 1420 09/21/16  0346 09/22/16 0338  NA 134* 134* 139  K 4.3 4.0 4.0  CL 104 107 110  CO2 22 20* 22  GLUCOSE 155* 121* 137*  BUN 44* 32* 29*  CREATININE 1.30* 1.02 0.93  CALCIUM 8.1* 7.5* 7.4*   Liver Function Tests:  Recent Labs Lab 09/20/16 1420  AST 77*  ALT 41  ALKPHOS 76  BILITOT 1.0  PROT 6.3*  ALBUMIN 2.9*   CBC:  Recent Labs Lab 09/20/16 1420 09/21/16 0346 09/22/16 0338  WBC 21.0* 18.5* 9.7  NEUTROABS 19.6*  --   --   HGB 13.5 12.2* 11.4*  HCT 37.8* 35.7*  34.0*  MCV 88.1 90.6 91.9  PLT 224 229 245   Studies: No results found.  Scheduled Meds: . amLODipine  5 mg Oral Daily  . apixaban  5 mg Oral BID  . azithromycin  500 mg Oral Q24H  . budesonide (PULMICORT) nebulizer solution  0.25 mg Nebulization BID  . cefTRIAXone (ROCEPHIN)  IV  1 g Intravenous Q24H  . ipratropium-albuterol  3 mL Nebulization TID   Continuous Infusions:   Principal Problem:   Community acquired pneumonia Active Problems:   Paroxysmal atrial fibrillation (HCC)   Essential hypertension   Acute kidney injury (HCC)    Time spent: 25 minutes    Vassie LollMadera, Dalia Jollie  [facility] Pager [phone]. If 7PM-7AM, please contact night-coverage at [email or web address], password [email or web address] 09/23/2016, 10:27 PM  LOS: 3 days

## 2016-09-23 NOTE — Progress Notes (Signed)
Pt ambulated in hallway on Room Air. Pt's O2 sats on room air 88-90%. Pt continues with dyspnea on exertion but tolerated this walk better than previous walk. Pt reports feeling "a little stronger than last time". Back to room and will continue to attempt weaning oxygen and Pt encouraged to use Incentive Spirometry

## 2016-09-23 NOTE — Progress Notes (Addendum)
Pt ambulated in the hallway on Room Air. Pt able to ambulate from room to the Nurses Station. Pt midly short of breath and states " My legs feel weak" On Room Air O2 Sat 86-88%. Back to room and placed on O2 2l with O2 sats 93-94%.

## 2016-09-24 DIAGNOSIS — I10 Essential (primary) hypertension: Secondary | ICD-10-CM

## 2016-09-24 DIAGNOSIS — J9601 Acute respiratory failure with hypoxia: Secondary | ICD-10-CM

## 2016-09-24 DIAGNOSIS — I480 Paroxysmal atrial fibrillation: Secondary | ICD-10-CM

## 2016-09-24 LAB — CBC
HEMATOCRIT: 39 % (ref 39.0–52.0)
Hemoglobin: 13.1 g/dL (ref 13.0–17.0)
MCH: 30.7 pg (ref 26.0–34.0)
MCHC: 33.6 g/dL (ref 30.0–36.0)
MCV: 91.3 fL (ref 78.0–100.0)
Platelets: 362 10*3/uL (ref 150–400)
RBC: 4.27 MIL/uL (ref 4.22–5.81)
RDW: 14.1 % (ref 11.5–15.5)
WBC: 10.3 10*3/uL (ref 4.0–10.5)

## 2016-09-24 LAB — BASIC METABOLIC PANEL
Anion gap: 6 (ref 5–15)
BUN: 29 mg/dL — AB (ref 6–20)
CALCIUM: 8.2 mg/dL — AB (ref 8.9–10.3)
CO2: 23 mmol/L (ref 22–32)
CREATININE: 0.96 mg/dL (ref 0.61–1.24)
Chloride: 108 mmol/L (ref 101–111)
GFR calc non Af Amer: >60 mL/min (ref >60)
Glucose, Bld: 112 mg/dL — ABNORMAL HIGH (ref 65–99)
Potassium: 4.7 mmol/L (ref 3.5–5.1)
Sodium: 137 mmol/L (ref 135–145)

## 2016-09-24 NOTE — Progress Notes (Signed)
Patient ambulated on hallway from room to the nurses station and 02 sat drop to 85% RA

## 2016-09-24 NOTE — Plan of Care (Signed)
Problem: Safety: Goal: Ability to remain free from injury will improve Outcome: Progressing Pt ambulating with one assist, slightly unsteady.    Problem: Pain Managment: Goal: General experience of comfort will improve Outcome: Completed/Met Date Met: 09/24/16 Denies pain.   Problem: Physical Regulation: Goal: Ability to maintain clinical measurements within normal limits will improve Outcome: Progressing BP 129/61 (BP Location: Right Arm)   Pulse 70   Temp 97.7 F (36.5 C) (Oral)   Resp 20   Ht _0  (1.778 m)   Wt 88 kg (193 lb 14.4 oz)   SpO2 91%   BMI 27.82 kg/m    Problem: Skin Integrity: Goal: Risk for impaired skin integrity will decrease Outcome: Completed/Met Date Met: 09/24/16 Pt with good mobility.  Braden score >18.  Problem: Tissue Perfusion: Goal: Risk factors for ineffective tissue perfusion will decrease Outcome: Completed/Met Date Met: 09/24/16 Not on IVF, good PO intake.    Problem: Bowel/Gastric: Goal: Will not experience complications related to bowel motility Continue to monitor.  Last BM 09/22/16.

## 2016-09-24 NOTE — Progress Notes (Signed)
Pt ambulated from room to nurse's station on room air.  SpO2 92-99%.  Weaned to room air.  Denies SOB.  Respirations regular and unlabored.  Will continue with plan of care.

## 2016-09-24 NOTE — Progress Notes (Signed)
PROGRESS NOTE    Randy Tucker  XLK:[id number] DOB: 1941-08-08 DOA: 09/20/2016 PCP: No primary care provider on file.   Brief Narrative: 75 y.o.malewith medical history significant of paroxysmal atrial fibrillation and hypertension. He went to the urgent care center for chills and was told he likely had a virus. He was told to take Tylenol which has not helped with the symptoms. He felt weak and had pre-syncopal episode. He did not hit his head and did not pass out. At that point, after lying there for a few hours, he was transferred to the emergency for evaluation  Assessment & Plan: # Sepsis due to committee acquired pneumonia on admission: -Patient is clinically improving. He is afebrile now. Try to wean oxygen slowly. Requiring 2 L of oxygen today morning. Continue ceftriaxone and azithromycin. May be able to switch to oral Levaquin by tomorrow. -Discussed with the patient, his wife, nursing staff and care management team regarding plan of care. [place] He needs hospital discharge follow-up before flying back to [place].  #Acute respiratory failure with hypoxia: Requiring 2 L of oxygen. Try to wean off to room air and slowly. Continue bronchodilators and respiratory treatment.  #Acute kidney injury likely in the setting of sepsis and dehydration: Serum creatinine level improved.  #Essential hypertension: Continue Norvasc. Monitor blood pressure.  #Paroxysmal atrial fibrillation: Heart rate is controlled. Currently on systemic anticoagulation. Continue to monitor.  Principal Problem:   Community acquired pneumonia Active Problems:   Paroxysmal atrial fibrillation (HCC)   Essential hypertension   Acute kidney injury (HCC)  DVT prophylaxis: Systemic anticoagulation Code Status: Full code Family Communication: Discussed with the patient's wife over phone Disposition Plan: Likely discharge home tomorrow with oral antibiotics.    Consultants:    None  Procedures: None Antimicrobials: Ceftriaxone and azithromycin.  Subjective: Patient was seen and examined at bedside. Patient reported feeling much better. Denied shortness of breath, cough, chest pain. No fever, chills, nausea, vomiting, dizziness or lightheadedness. Requiring 2 L of oxygen.   Objective: Vitals:   09/24/16 0528 09/24/16 0859 09/24/16 1430 09/24/16 1452  BP: 125/77  129/61   Pulse: 65  70   Resp: 20  20   Temp: 97.7 F (36.5 C)     TempSrc: Oral     SpO2: 98% 98% 92% 91%  Weight:      Height:        Intake/Output Summary (Last 24 hours) at 09/24/16 1828 Last data filed at 09/24/16 1503  Gross per 24 hour  Intake              220 ml  Output             1000 ml  Net             -780 ml   Filed Weights   09/20/16 1349 09/20/16 1720 09/21/16 2348  Weight: 86.2 kg (190 lb) 85.6 kg (188 lb 11.4 oz) 88 kg (193 lb 14.4 oz)    Examination:  General exam: Appears calm and comfortable  Respiratory system: Clear to auscultation bilateral, Respiratory effort normal. No wheezing or crackle Cardiovascular system: S1 & S2 heard, RRR.  No pedal edema. Gastrointestinal system: Abdomen is nondistended, soft and nontender. Normal bowel sounds heard. Central nervous system: Alert and oriented. No focal neurological deficits. Extremities: Symmetric 5 x 5 power. Skin: No rashes, lesions or ulcers Psychiatry: Judgement and insight appear normal. Mood & affect appropriate.     Data Reviewed: I have  personally reviewed following labs and imaging studies  CBC:  Recent Labs Lab 09/20/16 1420 09/21/16 0346 09/22/16 0338 09/24/16 0526  WBC 21.0* 18.5* 9.7 10.3  NEUTROABS 19.6*  --   --   --   HGB 13.5 12.2* 11.4* 13.1  HCT 37.8* 35.7* 34.0* 39.0  MCV 88.1 90.6 91.9 91.3  PLT 224 229 245 362   Basic Metabolic Panel:  Recent Labs Lab 09/20/16 1420 09/21/16 0346 09/22/16 0338 09/24/16 0526  NA 134* 134* 139 137  K 4.3 4.0 4.0 4.7  CL 104 107 110 108   CO2 22 20* 22 23  GLUCOSE 155* 121* 137* 112*  BUN 44* 32* 29* 29*  CREATININE 1.30* 1.02 0.93 0.96  CALCIUM 8.1* 7.5* 7.4* 8.2*   GFR: Estimated Creatinine Clearance: 74.3 mL/min (by C-G formula based on SCr of 0.96 mg/dL). Liver Function Tests:  Recent Labs Lab 09/20/16 1420  AST 77*  ALT 41  ALKPHOS 76  BILITOT 1.0  PROT 6.3*  ALBUMIN 2.9*   No results for input(s): LIPASE, AMYLASE in the last 168 hours. No results for input(s): AMMONIA in the last 168 hours. Coagulation Profile: No results for input(s): INR, PROTIME in the last 168 hours. Cardiac Enzymes: No results for input(s): CKTOTAL, CKMB, CKMBINDEX, TROPONINI in the last 168 hours. BNP (last 3 results) No results for input(s): PROBNP in the last 8760 hours. HbA1C: No results for input(s): HGBA1C in the last 72 hours. CBG: No results for input(s): GLUCAP in the last 168 hours. Lipid Profile: No results for input(s): CHOL, HDL, LDLCALC, TRIG, CHOLHDL, LDLDIRECT in the last 72 hours. Thyroid Function Tests: No results for input(s): TSH, T4TOTAL, FREET4, T3FREE, THYROIDAB in the last 72 hours. Anemia Panel: No results for input(s): VITAMINB12, FOLATE, FERRITIN, TIBC, IRON, RETICCTPCT in the last 72 hours. Sepsis Labs:  Recent Labs Lab 09/20/16 1433 09/21/16 2216  LATICACIDVEN 1.71 0.8    Recent Results (from the past 240 hour(s))  Blood Culture (routine x 2)     Status: None (Preliminary result)   Collection Time: 09/20/16  2:20 PM  Result Value Ref Range Status   Specimen Description BLOOD LEFT ANTECUBITAL  Final   Special Requests BOTTLES DRAWN AEROBIC AND ANAEROBIC 5 CC EACH  Final   Culture   Final    NO GROWTH 4 DAYS Performed at [facility]    Report Status PENDING  Incomplete  Blood Culture (routine x 2)     Status: None (Preliminary result)   Collection Time: 09/20/16  2:25 PM  Result Value Ref Range Status   Specimen Description BLOOD RIGHT ARM  Final   Special Requests BOTTLES  DRAWN AEROBIC AND ANAEROBIC 5 CC EACH  Final   Culture   Final    NO GROWTH 4 DAYS Performed at [facility]    Report Status PENDING  Incomplete  Urine culture     Status: None   Collection Time: 09/20/16  3:02 PM  Result Value Ref Range Status   Specimen Description URINE, CLEAN CATCH  Final   Special Requests NONE  Final   Culture NO GROWTH Performed at [facility]   Final   Report Status 09/21/2016 FINAL  Final  Culture, sputum-assessment     Status: None   Collection Time: 09/20/16  5:42 PM  Result Value Ref Range Status   Specimen Description SPUTUM  Final   Special Requests NONE  Final   Sputum evaluation THIS SPECIMEN IS ACCEPTABLE FOR SPUTUM CULTURE  Final  Report Status 09/21/2016 FINAL  Final  Culture, respiratory (NON-Expectorated)     Status: None   Collection Time: 09/21/16  1:00 PM  Result Value Ref Range Status   Specimen Description SPUTUM  Final   Special Requests NONE  Final   Gram Stain   Final    MODERATE WBC PRESENT,BOTH PMN AND MONONUCLEAR FEW SQUAMOUS EPITHELIAL CELLS PRESENT RARE GRAM POSITIVE COCCI IN PAIRS RARE GRAM NEGATIVE COCCI IN PAIRS RARE GRAM NEGATIVE RODS    Culture   Final    Consistent with normal respiratory flora. [facility]    Report Status 09/23/2016 FINAL  Final         Radiology Studies: No results found.      Scheduled Meds: . amLODipine  5 mg Oral Daily  . apixaban  5 mg Oral BID  . azithromycin  500 mg Oral Q24H  . budesonide (PULMICORT) nebulizer solution  0.25 mg Nebulization BID  . cefTRIAXone (ROCEPHIN)  IV  1 g Intravenous Q24H  . ipratropium-albuterol  3 mL Nebulization TID   Continuous Infusions:   LOS: 4 days    Aaliyha Mumford Jaynie CollinsPrasad Melton Walls, MD [facility] Pager [phone]  If 7PM-7AM, please contact night-coverage [email or web address] Password [email or web address] 09/24/2016, 6:28 PM

## 2016-09-24 NOTE — Progress Notes (Signed)
[facility] [address] [place] [phone]), walk in for follow up before going home to [place]. If pt is not off O2, he will need to check with his airline, and his insurance to have O2 at airport and his home on arrival to [place]. Will continue to follow.

## 2016-09-25 ENCOUNTER — Telehealth: Payer: Self-pay | Admitting: Acute Care

## 2016-09-25 ENCOUNTER — Inpatient Hospital Stay (HOSPITAL_COMMUNITY): Payer: PRIVATE HEALTH INSURANCE

## 2016-09-25 DIAGNOSIS — J9601 Acute respiratory failure with hypoxia: Secondary | ICD-10-CM

## 2016-09-25 DIAGNOSIS — J181 Lobar pneumonia, unspecified organism: Secondary | ICD-10-CM

## 2016-09-25 DIAGNOSIS — A419 Sepsis, unspecified organism: Principal | ICD-10-CM

## 2016-09-25 LAB — CULTURE, BLOOD (ROUTINE X 2)
CULTURE: NO GROWTH
Culture: NO GROWTH

## 2016-09-25 MED ORDER — LEVOFLOXACIN 750 MG PO TABS: 750.0000 mg | ORAL_TABLET | Freq: Every day | ORAL | 0 refills | Status: AC

## 2016-09-25 MED ORDER — DM-GUAIFENESIN ER 30-600 MG PO TB12: 1.0000 | ORAL_TABLET | Freq: Two times a day (BID) | ORAL | Status: DC | PRN

## 2016-09-25 MED ORDER — DM-GUAIFENESIN ER 30-600 MG PO TB12: 1.0000 | ORAL_TABLET | Freq: Two times a day (BID) | ORAL | 0 refills | Status: AC | PRN

## 2016-09-25 MED ORDER — ALBUTEROL SULFATE HFA 108 (90 BASE) MCG/ACT IN AERS: 2.0000 | INHALATION_SPRAY | Freq: Four times a day (QID) | RESPIRATORY_TRACT | 2 refills | Status: AC | PRN

## 2016-09-25 MED ORDER — POLYETHYLENE GLYCOL 3350 17 G PO PACK
17.0000 g | PACK | Freq: Every day | ORAL | Status: DC
  Administered 2016-09-25: 17 g via ORAL
  Filled 2016-09-25: qty 1

## 2016-09-25 NOTE — Progress Notes (Signed)
Pt O2 88 when RT went to give nebulizer treatment. Placed on 1L [place]. This RN tried to wean off, sats 87-89 on 0.5L, put back on 1L. Currently at 92% spo2. Will continue to monitor closely.

## 2016-09-25 NOTE — Progress Notes (Signed)
SATURATION QUALIFICATIONS: (This note is used to comply with regulatory documentation for home oxygen) ? ?Patient Saturations on Room Air at Rest = 94% ? ?Patient Saturations on Room Air while Ambulating = 91% ? ?Please briefly explain why patient needs home oxygen: ?

## 2016-09-25 NOTE — Discharge Summary (Signed)
Physician Discharge Summary  Kelwin Gibler WUJ:[id number] DOB: 24-Mar-1941 DOA: 09/20/2016  PCP: No primary care provider on file.  Admit date: 09/20/2016 Discharge date: 09/25/2016  Admitted From:Home Disposition:Home  Recommendations for Outpatient Follow-up:  1. Follow up with pulmonologist on 10/09/2016 with CXR.  Home Health:no Equipment/Devices:no Discharge Condition:stable CODE STATUS:full Diet recommendation:Heart healthy  Brief/Interim Summary:75 y.o.malewith medical history significant of paroxysmal atrial fibrillation and hypertension. He went to the urgent care center for chills and was told he likely had a virus. He was told to take Tylenol which has not helped with the symptoms. He felt weak and had pre-syncopal episode. He did not hit his head and did not pass out. At that point, after lying there for a few hours, he was transferred to the emergency for evaluation  # Sepsis due to community acquired pneumonia on admission: -Patient with acute hypoxic respiratory failure. Treated with ceftriaxone and azithromycin with clinical improvement. Patient's oxygen requirement gradually decreased. He was able to ambulate today on room air with acceptable oxygen saturation. Evaluated by pulmonologist recommended oral antibiotics and outpatient follow-up in clinic. The pulmonary clinic appointment already made. I discussed with the patient and his wife at bedside. They understand the discharge planning and the importance of getting x-ray and follow-up. At this time patient does not require oxygen to go home with. I advised patient to monitor oxygen level at home. Poorly consult appreciated. -Patient will discuss with the pulmonologist in follow-up visit to discuss about flying back to [place] -Discharge with oral Levaquin, Mucinex and albuterol MDI as needed.  #Acute respiratory failure with hypoxia: On room air. Ambulated in room air with acceptable saturation.  #Acute kidney injury  likely in the setting of sepsis and dehydration: Serum creatinine level improved.  #Essential hypertension: Continue Norvasc. Monitor blood pressure.  #Paroxysmal atrial fibrillation: Heart rate is controlled. Currently on systemic anticoagulation. Continue to monitor.  Patient denied fever, chills, shortness of breath, cough, nausea or vomiting. Reported feeling better. Oxygen saturation acceptable in room air this afternoon. Clinically stable to discharge home.  Discharge Diagnoses:  Principal Problem:   Community acquired pneumonia Active Problems:   Paroxysmal atrial fibrillation (HCC)   Essential hypertension   Acute kidney injury (HCC)   Acute respiratory failure with hypoxia [facility])    Discharge Instructions  Discharge Instructions    Call MD for:  difficulty breathing, headache or visual disturbances    Complete by:  As directed    Call MD for:  hives    Complete by:  As directed    Call MD for:  persistant dizziness or light-headedness    Complete by:  As directed    Call MD for:  persistant nausea and vomiting    Complete by:  As directed    Call MD for:  severe uncontrolled pain    Complete by:  As directed    Call MD for:  temperature >100.4    Complete by:  As directed    Diet - low sodium heart healthy    Complete by:  As directed    Discharge instructions    Complete by:  As directed    Please follow up with your PCP and pulmonologist on 10/09/2016.   Increase activity slowly    Complete by:  As directed      Allergies as of 09/25/2016   No Known Allergies     Medication List    TAKE these medications   acetaminophen 325 MG tablet Commonly known as:  TYLENOL Take  650 mg by mouth every 4 (four) hours as needed.   albuterol 108 (90 Base) MCG/ACT inhaler Commonly known as:  PROVENTIL HFA;VENTOLIN HFA Inhale 2 puffs into the lungs every 6 (six) hours as needed for wheezing or shortness of breath.   amLODipine 5 MG tablet Commonly known as:   NORVASC Take 5 mg by mouth daily.   apixaban 5 MG Tabs tablet Commonly known as:  ELIQUIS Take 5 mg by mouth 2 (two) times daily.   dextromethorphan-guaiFENesin 30-600 MG 12hr tablet Commonly known as:  MUCINEX DM Take 1 tablet by mouth 2 (two) times daily as needed for cough.   levofloxacin 750 MG tablet Commonly known as:  LEVAQUIN Take 1 tablet (750 mg total) by mouth daily.      Follow-up Information    Bevelyn NgoSarah F Groce, NP Follow up on 10/09/2016.   Specialty:  Pulmonary Disease Why:  at 830 for check in and CXR, thne 9 w/ Sarah Contact information: [address] [place] [address] [phone]          No Known Allergies  Consultations: Pulmonologist  Procedures/Studies: None  Subjective: Patient was seen and examined at multiple times today. In the afternoon patient reported feeling much better. Already evaluated by pulmonologist. Denied fever, chills, chest pain, shortness of breath. Patient's wife at bedside. Oxygen saturation acceptable in room air. I discussed discharge planning in detail including importance of outpatient follow-up. Verbalized understanding. Medically stable.   Discharge Exam: Vitals:   09/24/16 2100 09/25/16 1427  BP: 118/74 131/63  Pulse: 93 84  Resp: 16 16  Temp: 97.6 F (36.4 C) 97.4 F (36.3 C)   Vitals:   09/25/16 0744 09/25/16 0745 09/25/16 1403 09/25/16 1427  BP:    131/63  Pulse:    84  Resp:    16  Temp:    97.4 F (36.3 C)  TempSrc:    Oral  SpO2: 93% 93% 93% 94%  Weight:      Height:        General: Pt is alert, awake, not in acute distress Cardiovascular: RRR, S1/S2 +, no rubs, no gallops Respiratory: CTA bilaterally, no wheezing, no rhonchi Abdominal: Soft, NT, ND, bowel sounds + Extremities: no edema, no cyanosis    The results of significant diagnostics from this hospitalization (including imaging, microbiology, ancillary and laboratory) are listed below for reference.      Microbiology: Recent Results (from the past 240 hour(s))  Blood Culture (routine x 2)     Status: None (Preliminary result)   Collection Time: 09/20/16  2:20 PM  Result Value Ref Range Status   Specimen Description BLOOD LEFT ANTECUBITAL  Final   Special Requests BOTTLES DRAWN AEROBIC AND ANAEROBIC 5 CC EACH  Final   Culture   Final    NO GROWTH 4 DAYS Performed at [facility]    Report Status PENDING  Incomplete  Blood Culture (routine x 2)     Status: None (Preliminary result)   Collection Time: 09/20/16  2:25 PM  Result Value Ref Range Status   Specimen Description BLOOD RIGHT ARM  Final   Special Requests BOTTLES DRAWN AEROBIC AND ANAEROBIC 5 CC EACH  Final   Culture   Final    NO GROWTH 4 DAYS Performed at [facility]    Report Status PENDING  Incomplete  Urine culture     Status: None   Collection Time: 09/20/16  3:02 PM  Result Value Ref Range Status  Specimen Description URINE, CLEAN CATCH  Final   Special Requests NONE  Final   Culture NO GROWTH Performed at [facility]   Final   Report Status 09/21/2016 FINAL  Final  Culture, sputum-assessment     Status: None   Collection Time: 09/20/16  5:42 PM  Result Value Ref Range Status   Specimen Description SPUTUM  Final   Special Requests NONE  Final   Sputum evaluation THIS SPECIMEN IS ACCEPTABLE FOR SPUTUM CULTURE  Final   Report Status 09/21/2016 FINAL  Final  Culture, respiratory (NON-Expectorated)     Status: None   Collection Time: 09/21/16  1:00 PM  Result Value Ref Range Status   Specimen Description SPUTUM  Final   Special Requests NONE  Final   Gram Stain   Final    MODERATE WBC PRESENT,BOTH PMN AND MONONUCLEAR FEW SQUAMOUS EPITHELIAL CELLS PRESENT RARE GRAM POSITIVE COCCI IN PAIRS RARE GRAM NEGATIVE COCCI IN PAIRS RARE GRAM NEGATIVE RODS    Culture   Final    Consistent with normal respiratory flora. Performed at [facility]    Report Status 09/23/2016 FINAL   Final     Labs: BNP (last 3 results) No results for input(s): BNP in the last 8760 hours. Basic Metabolic Panel:  Recent Labs Lab 09/20/16 1420 09/21/16 0346 09/22/16 0338 09/24/16 0526  NA 134* 134* 139 137  K 4.3 4.0 4.0 4.7  CL 104 107 110 108  CO2 22 20* 22 23  GLUCOSE 155* 121* 137* 112*  BUN 44* 32* 29* 29*  CREATININE 1.30* 1.02 0.93 0.96  CALCIUM 8.1* 7.5* 7.4* 8.2*   Liver Function Tests:  Recent Labs Lab 09/20/16 1420  AST 77*  ALT 41  ALKPHOS 76  BILITOT 1.0  PROT 6.3*  ALBUMIN 2.9*   No results for input(s): LIPASE, AMYLASE in the last 168 hours. No results for input(s): AMMONIA in the last 168 hours. CBC:  Recent Labs Lab 09/20/16 1420 09/21/16 0346 09/22/16 0338 09/24/16 0526  WBC 21.0* 18.5* 9.7 10.3  NEUTROABS 19.6*  --   --   --   HGB 13.5 12.2* 11.4* 13.1  HCT 37.8* 35.7* 34.0* 39.0  MCV 88.1 90.6 91.9 91.3  PLT 224 229 245 362   Cardiac Enzymes: No results for input(s): CKTOTAL, CKMB, CKMBINDEX, TROPONINI in the last 168 hours. BNP: Invalid input(s): POCBNP CBG: No results for input(s): GLUCAP in the last 168 hours. D-Dimer No results for input(s): DDIMER in the last 72 hours. Hgb A1c No results for input(s): HGBA1C in the last 72 hours. Lipid Profile No results for input(s): CHOL, HDL, LDLCALC, TRIG, CHOLHDL, LDLDIRECT in the last 72 hours. Thyroid function studies No results for input(s): TSH, T4TOTAL, T3FREE, THYROIDAB in the last 72 hours.  Invalid input(s): FREET3 Anemia work up No results for input(s): VITAMINB12, FOLATE, FERRITIN, TIBC, IRON, RETICCTPCT in the last 72 hours. Urinalysis    Component Value Date/Time   COLORURINE YELLOW 09/20/2016 1502   APPEARANCEUR HAZY (A) 09/20/2016 1502   LABSPEC 1.019 09/20/2016 1502   PHURINE 5.0 09/20/2016 1502   GLUCOSEU NEGATIVE 09/20/2016 1502   HGBUR LARGE (A) 09/20/2016 1502   BILIRUBINUR NEGATIVE 09/20/2016 1502   KETONESUR NEGATIVE 09/20/2016 1502   PROTEINUR 30 (A)  09/20/2016 1502   NITRITE NEGATIVE 09/20/2016 1502   LEUKOCYTESUR NEGATIVE 09/20/2016 1502   Sepsis Labs Invalid input(s): PROCALCITONIN,  WBC,  LACTICIDVEN Microbiology Recent Results (from the past 240 hour(s))  Blood Culture (routine x 2)  Status: None (Preliminary result)   Collection Time: 09/20/16  2:20 PM  Result Value Ref Range Status   Specimen Description BLOOD LEFT ANTECUBITAL  Final   Special Requests BOTTLES DRAWN AEROBIC AND ANAEROBIC 5 CC EACH  Final   Culture   Final    NO GROWTH 4 DAYS Performed at [facility]    Report Status PENDING  Incomplete  Blood Culture (routine x 2)     Status: None (Preliminary result)   Collection Time: 09/20/16  2:25 PM  Result Value Ref Range Status   Specimen Description BLOOD RIGHT ARM  Final   Special Requests BOTTLES DRAWN AEROBIC AND ANAEROBIC 5 CC EACH  Final   Culture   Final    NO GROWTH 4 DAYS Performed at [facility]    Report Status PENDING  Incomplete  Urine culture     Status: None   Collection Time: 09/20/16  3:02 PM  Result Value Ref Range Status   Specimen Description URINE, CLEAN CATCH  Final   Special Requests NONE  Final   Culture NO GROWTH Performed at [facility]   Final   Report Status 09/21/2016 FINAL  Final  Culture, sputum-assessment     Status: None   Collection Time: 09/20/16  5:42 PM  Result Value Ref Range Status   Specimen Description SPUTUM  Final   Special Requests NONE  Final   Sputum evaluation THIS SPECIMEN IS ACCEPTABLE FOR SPUTUM CULTURE  Final   Report Status 09/21/2016 FINAL  Final  Culture, respiratory (NON-Expectorated)     Status: None   Collection Time: 09/21/16  1:00 PM  Result Value Ref Range Status   Specimen Description SPUTUM  Final   Special Requests NONE  Final   Gram Stain   Final    MODERATE WBC PRESENT,BOTH PMN AND MONONUCLEAR FEW SQUAMOUS EPITHELIAL CELLS PRESENT RARE GRAM POSITIVE COCCI IN PAIRS RARE GRAM NEGATIVE COCCI IN PAIRS RARE  GRAM NEGATIVE RODS    Culture   Final    Consistent with normal respiratory flora. Performed at [facility]    Report Status 09/23/2016 FINAL  Final     Time coordinating discharge: Over 30 minutes  SIGNED:   Maxie Barbron Prasad Zakara Parkey, MD  [facility] 09/25/2016, 2:37 PM  If 7PM-7AM, please contact night-coverage [email or web address] Password [email or web address]

## 2016-09-25 NOTE — Progress Notes (Signed)
Pt ambulated in hallway with stand by assist and on room air. Pt O2 sats 90-93% with ambulation and Pt voiced " I feel a little stronger with this walk" Pt was noted to be with decreased shortness of breath with this walk.

## 2016-09-25 NOTE — Progress Notes (Signed)
PROGRESS NOTE    Randy Tucker  RUE:[id number]:[id number] DOB: 08-07-1941 DOA: 09/20/2016 PCP: No primary care provider on file.   Brief Narrative: 75 y.o.malewith medical history significant of paroxysmal atrial fibrillation and hypertension. He went to the urgent care center for chills and was told he likely had a virus. He was told to take Tylenol which has not helped with the symptoms. He felt weak and had pre-syncopal episode. He did not hit his head and did not pass out. At that point, after lying there for a few hours, he was transferred to the emergency for evaluation  Assessment & Plan: # Sepsis due to committee acquired pneumonia on admission: -Patient desaturates especially while ambulating. Still requiring 2 L of oxygen to maintain acceptable oxygen saturation. Repeat chest x-ray today showed persistent right upper lobe consolation with increased volume loss. I consulted pulmonologist to further evaluate the patient. I discussed with Dr. Jamison NeighborNestor. I will continue current antibiotics with ceftriaxone and erythromycin. Continue to try to wean off oxygen gradually. I discussed this with the patient and his wife in detail at bedside. -Discussed with the patient, his wife, nursing staff and care management team regarding plan of care. [place]. He needs hospital discharge follow-up before flying back to [place].  #Acute respiratory failure with hypoxia: Requiring 2 L of oxygen. Try to wean off to room air and slowly. Continue bronchodilators and respiratory treatment.  #Acute kidney injury likely in the setting of sepsis and dehydration: Serum creatinine level improved.  #Essential hypertension: Continue Norvasc. Monitor blood pressure.  #Paroxysmal atrial fibrillation: Heart rate is controlled. Currently on systemic anticoagulation. Continue to monitor.  Principal Problem:   Community acquired pneumonia Active Problems:   Paroxysmal atrial fibrillation (HCC)   Essential  hypertension   Acute kidney injury (HCC)   Acute respiratory failure with hypoxia (HCC)  DVT prophylaxis: Systemic anticoagulation Code Status: Full code Family Communication: Discussed with the patient's wife over phone Disposition Plan: Likely discharge home tomorrow with oral antibiotics.    Consultants:   None  Procedures: None Antimicrobials: Ceftriaxone and azithromycin.  Subjective: Patient was seen and examined at bedside. Patient reported stable shortness of breath. Denied cough, chest pain, fever or chills. Requiring 2 L of oxygen. Has generalized weakness. Wife at bedside. Objective: Vitals:   09/25/16 0546 09/25/16 0637 09/25/16 0744 09/25/16 0745  BP:      Pulse:      Resp:      Temp:      TempSrc:      SpO2: 94% 93% 93% 93%  Weight:      Height:        Intake/Output Summary (Last 24 hours) at 09/25/16 1218 Last data filed at 09/25/16 0855  Gross per 24 hour  Intake              580 ml  Output             1250 ml  Net             -670 ml   Filed Weights   09/20/16 1349 09/20/16 1720 09/21/16 2348  Weight: 86.2 kg (190 lb) 85.6 kg (188 lb 11.4 oz) 88 kg (193 lb 14.4 oz)    Examination:  General exam: Pleasant male sitting on chair comfortable,  Respiratory system: Right-sided minimal crackle, respiratory effort normal. No wheezing appreciated. Cardiovascular system: Regular rate rhythm, S1 and S2 normal. No pedal edema. Gastrointestinal system: Abdomen is nondistended, soft and nontender. Normal bowel  sounds heard. Central nervous system: Alert and oriented. No focal neurological deficits. Extremities: Symmetric 5 x 5 power. Skin: No rashes, lesions or ulcers Psychiatry: Judgement and insight appear normal. Mood & affect appropriate.     Data Reviewed: I have personally reviewed following labs and imaging studies  CBC:  Recent Labs Lab 09/20/16 1420 09/21/16 0346 09/22/16 0338 09/24/16 0526  WBC 21.0* 18.5* 9.7 10.3  NEUTROABS 19.6*  --    --   --   HGB 13.5 12.2* 11.4* 13.1  HCT 37.8* 35.7* 34.0* 39.0  MCV 88.1 90.6 91.9 91.3  PLT 224 229 245 362   Basic Metabolic Panel:  Recent Labs Lab 09/20/16 1420 09/21/16 0346 09/22/16 0338 09/24/16 0526  NA 134* 134* 139 137  K 4.3 4.0 4.0 4.7  CL 104 107 110 108  CO2 22 20* 22 23  GLUCOSE 155* 121* 137* 112*  BUN 44* 32* 29* 29*  CREATININE 1.30* 1.02 0.93 0.96  CALCIUM 8.1* 7.5* 7.4* 8.2*   GFR: Estimated Creatinine Clearance: 74.3 mL/min (by C-G formula based on SCr of 0.96 mg/dL). Liver Function Tests:  Recent Labs Lab 09/20/16 1420  AST 77*  ALT 41  ALKPHOS 76  BILITOT 1.0  PROT 6.3*  ALBUMIN 2.9*   No results for input(s): LIPASE, AMYLASE in the last 168 hours. No results for input(s): AMMONIA in the last 168 hours. Coagulation Profile: No results for input(s): INR, PROTIME in the last 168 hours. Cardiac Enzymes: No results for input(s): CKTOTAL, CKMB, CKMBINDEX, TROPONINI in the last 168 hours. BNP (last 3 results) No results for input(s): PROBNP in the last 8760 hours. HbA1C: No results for input(s): HGBA1C in the last 72 hours. CBG: No results for input(s): GLUCAP in the last 168 hours. Lipid Profile: No results for input(s): CHOL, HDL, LDLCALC, TRIG, CHOLHDL, LDLDIRECT in the last 72 hours. Thyroid Function Tests: No results for input(s): TSH, T4TOTAL, FREET4, T3FREE, THYROIDAB in the last 72 hours. Anemia Panel: No results for input(s): VITAMINB12, FOLATE, FERRITIN, TIBC, IRON, RETICCTPCT in the last 72 hours. Sepsis Labs:  Recent Labs Lab 09/20/16 1433 09/21/16 2216  LATICACIDVEN 1.71 0.8    Recent Results (from the past 240 hour(s))  Blood Culture (routine x 2)     Status: None (Preliminary result)   Collection Time: 09/20/16  2:20 PM  Result Value Ref Range Status   Specimen Description BLOOD LEFT ANTECUBITAL  Final   Special Requests BOTTLES DRAWN AEROBIC AND ANAEROBIC 5 CC EACH  Final   Culture   Final    NO GROWTH 4  DAYS Performed at [facility]    Report Status PENDING  Incomplete  Blood Culture (routine x 2)     Status: None (Preliminary result)   Collection Time: 09/20/16  2:25 PM  Result Value Ref Range Status   Specimen Description BLOOD RIGHT ARM  Final   Special Requests BOTTLES DRAWN AEROBIC AND ANAEROBIC 5 CC EACH  Final   Culture   Final    NO GROWTH 4 DAYS Performed at [facility]    Report Status PENDING  Incomplete  Urine culture     Status: None   Collection Time: 09/20/16  3:02 PM  Result Value Ref Range Status   Specimen Description URINE, CLEAN CATCH  Final   Special Requests NONE  Final   Culture NO GROWTH Performed at [facility]   Final   Report Status 09/21/2016 FINAL  Final  Culture, sputum-assessment     Status:  None   Collection Time: 09/20/16  5:42 PM  Result Value Ref Range Status   Specimen Description SPUTUM  Final   Special Requests NONE  Final   Sputum evaluation THIS SPECIMEN IS ACCEPTABLE FOR SPUTUM CULTURE  Final   Report Status 09/21/2016 FINAL  Final  Culture, respiratory (NON-Expectorated)     Status: None   Collection Time: 09/21/16  1:00 PM  Result Value Ref Range Status   Specimen Description SPUTUM  Final   Special Requests NONE  Final   Gram Stain   Final    MODERATE WBC PRESENT,BOTH PMN AND MONONUCLEAR FEW SQUAMOUS EPITHELIAL CELLS PRESENT RARE GRAM POSITIVE COCCI IN PAIRS RARE GRAM NEGATIVE COCCI IN PAIRS RARE GRAM NEGATIVE RODS    Culture   Final    Consistent with normal respiratory flora. Performed at [facility]    Report Status 09/23/2016 FINAL  Final         Radiology Studies: Dg Chest Port 1 View  Result Date: 09/25/2016 CLINICAL DATA:  Fever and weakness.  Follow-up. EXAM: PORTABLE CHEST 1 VIEW COMPARISON:  09/21/2016 FINDINGS: The cardiac silhouette is mildly enlarged. There is persistent dense right upper lobe consolidation with increased right upper lobe volume loss compared to the prior  study. There are likely trace bilateral pleural effusions, unchanged. No pneumothorax. IMPRESSION: Persistent right upper lobe consolidation with increased volume loss. Electronically Signed   By: Sebastian AcheAllen  Grady M.D.   On: 09/25/2016 07:37        Scheduled Meds: . amLODipine  5 mg Oral Daily  . apixaban  5 mg Oral BID  . azithromycin  500 mg Oral Q24H  . budesonide (PULMICORT) nebulizer solution  0.25 mg Nebulization BID  . cefTRIAXone (ROCEPHIN)  IV  1 g Intravenous Q24H  . ipratropium-albuterol  3 mL Nebulization TID  . polyethylene glycol  17 g Oral Daily   Continuous Infusions:   LOS: 5 days    Tehran Rabenold Jaynie CollinsPrasad Kenshin Splawn, MD [facility] Pager [phone]  If 7PM-7AM, please contact night-coverage [email or web address] Password [email or web address] 09/25/2016, 12:18 PM

## 2016-09-25 NOTE — Consult Note (Signed)
Name: Randy Tucker MRN: [id number] DOB: 04/12/1941    ADMISSION DATE:  09/20/2016 CONSULTATION DATE:  12/21  REFERRING MD :  Ronalee BeltsBhandari  CHIEF COMPLAINT:  Pneumonia   HISTORY OF PRESENT ILLNESS:   This is a 75 year old male from the [place] w/ sig h/o PAF and HTN. Presented to an urgent care on 12/14 w/ 5d h/o fever, chills, weakness. Symptom onset started approx one day before flying to the [place]. At that time flu swab neg but felt to be viral. Told to take tylenol and rest. On 12/16 presented to the ED so weak that he fell to the ground. Was incont of urine and stool as he could not get up. In ER: febrile, + SIRS, CXR w/ RUL infiltrate. Admitted w/ working dx of CAP. U strep neg, Influenza neg. Treated w/ azith and rocephin. Since admit fever and WBC curve improved but f/u CXR w/out any sig improvement. PCCM asked to see to make out-pt recs.    PAST MEDICAL HISTORY :   has a past medical history of Atrial fibrillation (HCC) and Hypertension.  has no past surgical history on file. Prior to Admission medications   Medication Sig Start Date End Date Taking? Authorizing Provider  acetaminophen (TYLENOL) 325 MG tablet Take 650 mg by mouth every 4 (four) hours as needed.   Yes Historical Provider, MD  amLODipine (NORVASC) 5 MG tablet Take 5 mg by mouth daily.   Yes Historical Provider, MD  apixaban (ELIQUIS) 5 MG TABS tablet Take 5 mg by mouth 2 (two) times daily.   Yes Historical Provider, MD   No Known Allergies  FAMILY HISTORY:  family history includes Lung cancer in his father. SOCIAL HISTORY:  reports that he has never smoked. He has never used smokeless tobacco. Works in hospital   REVIEW OF SYSTEMS:   Constitutional: Negative for fever, chills, weight loss, malaise/fatigue and diaphoresis. -->all resolved. Feeling stronger  HENT: Negative for hearing loss, ear pain, nosebleeds, congestion, sore throat, neck pain, tinnitus and ear discharge.   Eyes: Negative for blurred vision, double  vision, photophobia, pain, discharge and redness.  Respiratory: Negative for cough, hemoptysis, sputum production, shortness of breath, wheezing and stridor.  No pleuritic chest pain  Cardiovascular: Negative for chest pain, palpitations, orthopnea, claudication, leg swelling and PND.  Gastrointestinal: Negative for heartburn, nausea, vomiting, abdominal pain, diarrhea, constipation, blood in stool and melena.  Genitourinary: Negative for dysuria, urgency, frequency, hematuria and flank pain.  Musculoskeletal: Negative for myalgias, back pain, joint pain and falls.  Skin: Negative for itching and rash.  Neurological: Negative for dizziness, tingling, tremors, sensory change, speech change, focal weakness, seizures, loss of consciousness, weakness and headaches.  Endo/Heme/Allergies: Negative for environmental allergies and polydipsia. Does not bruise/bleed easily.  SUBJECTIVE:  Feels better  VITAL SIGNS: Temp:  [97.6 F (36.4 C)] 97.6 F (36.4 C) (12/20 2100) Pulse Rate:  [70-93] 93 (12/20 2100) Resp:  [16-20] 16 (12/20 2100) BP: (118-129)/(61-74) 118/74 (12/20 2100) SpO2:  [83 %-94 %] 93 % (12/21 0745) Room air PHYSICAL EXAMINATION: General:  75 year old male, up in chair. No distress. Talking in full sentences  Neuro:  Awake, alert no focal def HEENT:  NCAT, no JVD MMM Cardiovascular:  RRR w/out MRG Lungs:  Clear and no accessory use  Abdomen:  Soft, not tender  Musculoskeletal:  Equal bulk and st Skin:  Warm and dry    Recent Labs Lab 09/21/16 0346 09/22/16 0338 09/24/16 0526  NA 134* 139 137  K 4.0 4.0 4.7  CL 107 110 108  CO2 20* 22 23  BUN 32* 29* 29*  CREATININE 1.02 0.93 0.96  GLUCOSE 121* 137* 112*    Recent Labs Lab 09/21/16 0346 09/22/16 0338 09/24/16 0526  HGB 12.2* 11.4* 13.1  HCT 35.7* 34.0* 39.0  WBC 18.5* 9.7 10.3  PLT 229 245 362   Dg Chest Port 1 View  Result Date: 09/25/2016 CLINICAL DATA:  Fever and weakness.  Follow-up. EXAM: PORTABLE  CHEST 1 VIEW COMPARISON:  09/21/2016 FINDINGS: The cardiac silhouette is mildly enlarged. There is persistent dense right upper lobe consolidation with increased right upper lobe volume loss compared to the prior study. There are likely trace bilateral pleural effusions, unchanged. No pneumothorax. IMPRESSION: Persistent right upper lobe consolidation with increased volume loss. Electronically Signed   By: Sebastian AcheAllen  Grady M.D.   On: 09/25/2016 07:37    ASSESSMENT / PLAN:  Acute hypoxic respiratory failure  RUL CAP  > influenza neg > u strep neg > NOS in sputum   Discussion   Recs: -Complete 10d abx (change to Levaquin oral) -walking oximetry prior to dc  -Will set him up w/ f/u in our office w/ CXR and walking oximetry.   Will see Jan 4th at 830 cxr and then 9am w/ Penelope GalasSarah   Peter E Babcock ACNP-BC [facility] Pager # [phone] OR # [phone] if no answer    09/25/2016, 11:23 AM

## 2016-09-25 NOTE — Telephone Encounter (Signed)
Spoke with the son, he is aware that his dad has an upcoming appt. And that they need an xray. Looks like the MD in the office was the one who recc. This. Nothing further is needed the order was placed by physician in the hospital.

## 2016-09-25 NOTE — Progress Notes (Addendum)
Pt desat to 83 on 2L [place]. Pt does not complain of SOB or any respiratory distress. Respiratory therapist called for PRN breathing treatment, pt came up to 95% after treatment. Respiratory suggested repeat chest x-ray before discharge. Pt currently sitting at 91% on 2L [place]. NP on-call paged to place orders. Will continue to monitor closely.

## 2016-09-25 NOTE — Progress Notes (Addendum)
Attempted to wean oxygen overnight, pt currently sitting at 90% on 2L [place]. Pt desats to 86-89% when attempted to wean. Will continue to monitor closely.

## 2016-10-09 ENCOUNTER — Other Ambulatory Visit: Payer: Self-pay | Admitting: Acute Care

## 2016-10-09 ENCOUNTER — Ambulatory Visit (INDEPENDENT_AMBULATORY_CARE_PROVIDER_SITE_OTHER): Payer: PRIVATE HEALTH INSURANCE | Admitting: Acute Care

## 2016-10-09 ENCOUNTER — Ambulatory Visit: Payer: Self-pay

## 2016-10-09 ENCOUNTER — Encounter: Payer: Self-pay | Admitting: Acute Care

## 2016-10-09 ENCOUNTER — Ambulatory Visit (INDEPENDENT_AMBULATORY_CARE_PROVIDER_SITE_OTHER)
Admission: RE | Admit: 2016-10-09 | Discharge: 2016-10-09 | Disposition: A | Discharge status: Home / Self Care | Payer: PRIVATE HEALTH INSURANCE | Source: Ambulatory Visit | Attending: Internal Medicine | Admitting: Internal Medicine

## 2016-10-09 DIAGNOSIS — J189 Pneumonia, unspecified organism: Secondary | ICD-10-CM

## 2016-10-09 DIAGNOSIS — J181 Lobar pneumonia, unspecified organism: Secondary | ICD-10-CM

## 2016-10-09 MED ORDER — LEVOFLOXACIN 750 MG PO TABS: 750.0000 mg | ORAL_TABLET | Freq: Every day | ORAL | 0 refills | Status: AC

## 2016-10-09 NOTE — Assessment & Plan Note (Addendum)
CAP with sepsis  Resolving infiltrate per CXR Desire to return home to [place]/ wanting medical clearance to fly. Plan: We will give treat you with additional Levaquin 750 mg x 5 days. Complete all medication until gone. We will give you clearance to fly back to [place]. We will walk you in the office today to ensure oxygen saturations are adequate to fly. Please follow up with your GP within a week  when you get back to the [place]. Please have him check CXR, CBC and Renal function upon return to [place].[name]. Prevnar 23 when you return to [place]  Remember to hydrate for the flight. Blood pressure remains on the low side, stay hydrated. Rest and do not over do. Follow up with the office if needed prior to leaving the country, or seek emergency care.

## 2016-10-09 NOTE — Progress Notes (Signed)
Note reviewed.  Donna ChristenJennings E. Jamison NeighborNestor, M.D. [facility] Pager:  [phone] After 3pm or if no response, call [phone] 2:02 PM 10/09/16

## 2016-10-09 NOTE — Patient Instructions (Addendum)
It is nice to meet you today. We will give treat you with additional Levaquin 750 mg x 5 days. Complete all medication until gone. We will give you clearance to fly back to [place]. We will walk you in the office today to ensure oxygen saturations are adequate to fly. Please follow up with your GP within a week  when you get back to the [place]. Please have him check CXR, CBC and Renal function. Prevnar 23 when you return to [place]  Remember to hydrate for the flight. Rest and do not over do. Follow up with the office if needed prior to leaving the country, or seek emergency care.

## 2016-10-09 NOTE — Progress Notes (Signed)
History of Present Illness Randy Tucker is a 76 y.o. male never smoker from the [place] with history of PAF and HTN. He is on chronic Eliquis. He was seen as an inpatient consult for Acute Hypoxic Respiratory Failure/Sepsis secondary to severe CAP ( RUL)  by Dr. Jamison NeighborNestor and Anders SimmondsPete Babcock, NP. Family History includes lung cancer in his father.   10/09/2016 Hospital Follow Up: Pt. Presents for hospital follow up. [facility]16-12/21/2017 for Acute on Chronic Respiratory Failure secondary to a right upper lobe severe community acquired pneumonia, and sepsis secondary to severe CAP.He was treated with ceftriaxone and azithromycin with clinical improvement.He was ambulated on room air prior to discharge. Saturations dropped from 94% at rest to 91% with ambulation. He was deemed stable for discharge without oxygen, with follow up planned with this office today. He was discharged on oral Levaquin x 10 days, Mucinex and Albuterol MDI as needed. Acute kidney injury in setting of sepsis and dehydration improved with volume resuscitation. Leukocytosis was resolving. He presents today stating that he is feeling better. He is walking and exercising. Exercise oxygen saturations are from 92-96%. He denies chest pain, orthopnea, fever, or hemoptysis. He is ambulating at home and walking.Oxygen saturations in the office today did not fall with ambulation. CXR indicates partial clearing of right upper lobe pneumonia with minimal opacity remaining.I will treat with an additional 5 days of Levaquin. Pt. Is anxious to return home ( [place]). We will clear him to fly with understanding he is to complete the additional antibiotic dosing, and see his GP upon return to [place] within a week of landing. He verbalized understanding.     Tests 10/09/2016: CXR : IMPRESSION: Partial clearing of right upper lobe pneumonia with minimal opacity Remaining.  09/25/2106 CXR The cardiac silhouette is mildly enlarged.  There is persistent dense right upper lobe consolidation with increased right upper lobe volume loss compared to the prior study. There are likely trace bilateral pleural effusions, unchanged  MICROBIOLOGY: Sputum Ctx 12/17:  Oral [place] Urine Strep Ag 12/17:  Negative Influenza PCR 12/16:  Negative  Past medical hx Past Medical History:  Diagnosis Date  . Atrial fibrillation (HCC)   . Hypertension      Past surgical hx, Family hx, Social hx all reviewed.  Current Outpatient Prescriptions on File Prior to Visit  Medication Sig  . albuterol (PROVENTIL HFA;VENTOLIN HFA) 108 (90 Base) MCG/ACT inhaler Inhale 2 puffs into the lungs every 6 (six) hours as needed for wheezing or shortness of breath.  Marland Kitchen. amLODipine (NORVASC) 5 MG tablet Take 5 mg by mouth daily.  Marland Kitchen. apixaban (ELIQUIS) 5 MG TABS tablet Take 5 mg by mouth 2 (two) times daily.  Marland Kitchen. acetaminophen (TYLENOL) 325 MG tablet Take 650 mg by mouth every 4 (four) hours as needed.  Marland Kitchen. dextromethorphan-guaiFENesin (MUCINEX DM) 30-600 MG 12hr tablet Take 1 tablet by mouth 2 (two) times daily as needed for cough. (Patient not taking: Reported on 10/09/2016)   No current facility-administered medications on file prior to visit.      No Known Allergies  Review Of Systems:  Constitutional:   No  weight loss, night sweats,  Fevers, chills, + fatigue, or  lassitude.  HEENT:   No headaches,  Difficulty swallowing,  Tooth/dental problems, or  Sore throat,                No sneezing, itching, ear ache, nasal congestion, post nasal drip,   CV:  No chest pain,  Orthopnea, PND, swelling in lower extremities, anasarca, dizziness, palpitations, syncope.   GI  No heartburn, indigestion, abdominal pain, nausea, vomiting, diarrhea, change in bowel habits, loss of appetite, bloody stools.   Resp: No shortness of breath with exertion or at rest.  No excess mucus, no productive cough,  No non-productive cough,  No coughing up of blood.  No change in color  of mucus.  No wheezing.  No chest wall deformity  Skin: no rash or lesions.  GU: no dysuria, change in color of urine, no urgency or frequency.  No flank pain, no hematuria   MS:  No joint pain or swelling.  No decreased range of motion.  No back pain.  Psych:  No change in mood or affect. No depression or anxiety.  No memory loss.   Vital Signs BP 102/68 (BP Location: Left Arm, Patient Position: Sitting, Cuff Size: Normal)   Pulse 97   Ht 5\' 10"  (1.778 m)   Wt 187 lb 3.2 oz (84.9 kg)   SpO2 97%   BMI 26.86 kg/m    Physical Exam:  General- No distress,  A&Ox3, pleasant ENT: No sinus tenderness, TM clear, pale nasal mucosa, no oral exudate,no post nasal drip, no LAN Cardiac: S1, S2, regular rate and rhythm, no murmur Chest: No wheeze/ rales/ dullness; no accessory muscle use, no nasal flaring, no sternal retractions Abd.: Soft Non-tender Ext: No clubbing cyanosis, edema Neuro:  normal strength, continues to feel weak when over exerted. Skin: No rashes, warm and dry Psych: normal mood and behavior, appropriate   Assessment/Plan  Community acquired pneumonia CAP with sepsis  Resolving infiltrate per CXR Desire to return home to [place]/ wanting medical clearance to fly. Plan: We will give treat you with additional Levaquin 750 mg x 5 days. Complete all medication until gone. We will give you clearance to fly back to [place]. We will walk you in the office today to ensure oxygen saturations are adequate to fly. Please follow up with your GP within a week  when you get back to the [place]. Please have him check CXR, CBC and Renal function upon return to [place].[name] 23 when you return to [place]  Remember to hydrate for the flight. Blood pressure remains on the low side, stay hydrated. Rest and do not over do. Follow up with the office if needed prior to leaving the country, or seek emergency care.     Bevelyn Ngo, NP 10/09/2016  1:38 PM

## 2016-10-10 ENCOUNTER — Telehealth: Payer: Self-pay | Admitting: Acute Care

## 2016-10-10 NOTE — Telephone Encounter (Signed)
Alberteen SpindleMargie Took this call

## 2016-10-10 NOTE — Telephone Encounter (Signed)
Looks like Delray AltMargie was able to speak with them.

## 2016-10-10 NOTE — Telephone Encounter (Signed)
Spoke with Randy Tucker, she was unable to reach a nurse and since the call was from the [place] they stated that we may not be able to get through to them so no number was taken, will await their call back for more information

## 2017-05-04 IMAGING — DX DG CHEST 2V
2 series · 2 of 2 positions shown · non-contrast
Comparison: 09/20/2016

CLINICAL DATA: SOB. Hx of HTN.

EXAM:
CHEST  2 VIEW

[chest pa]
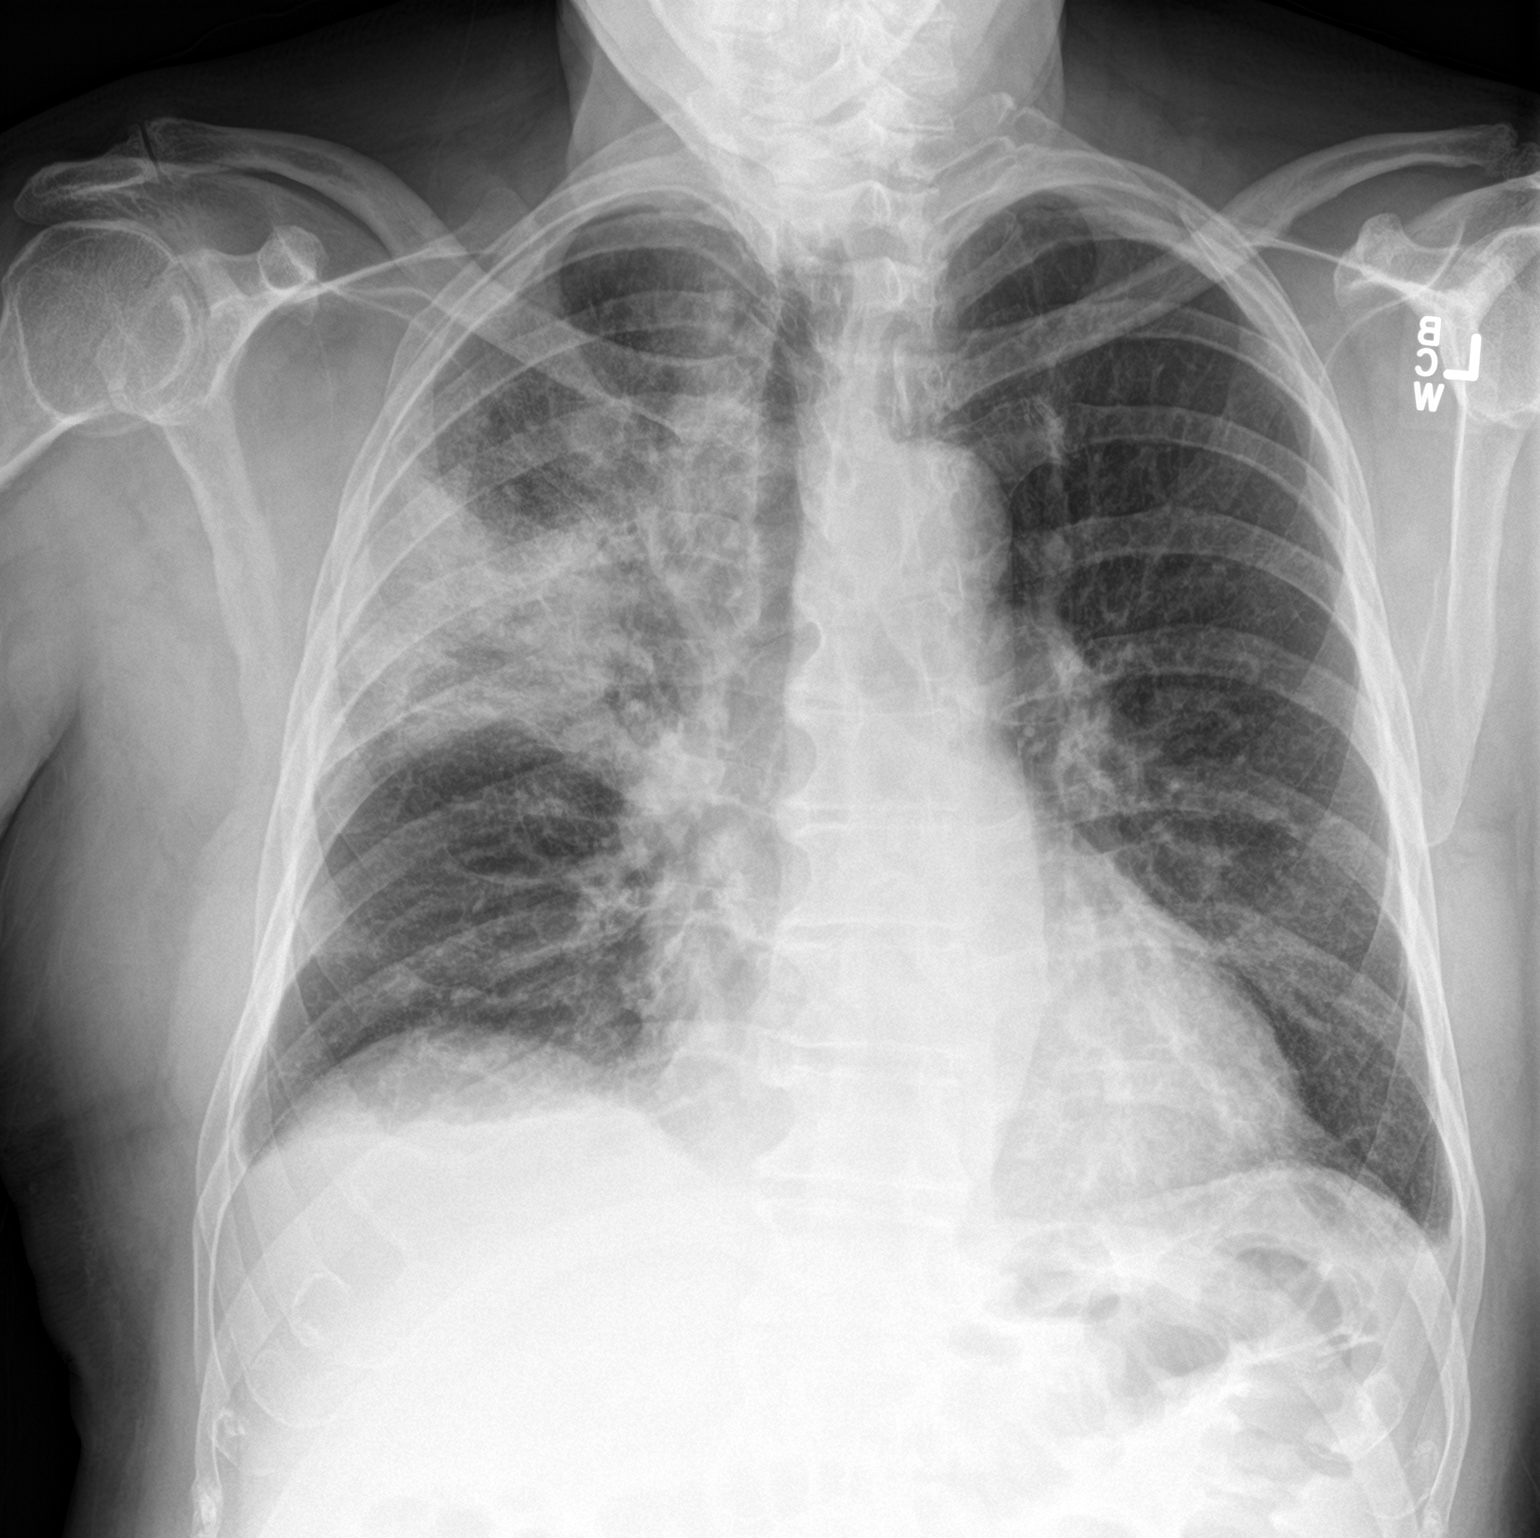

[chest lat]
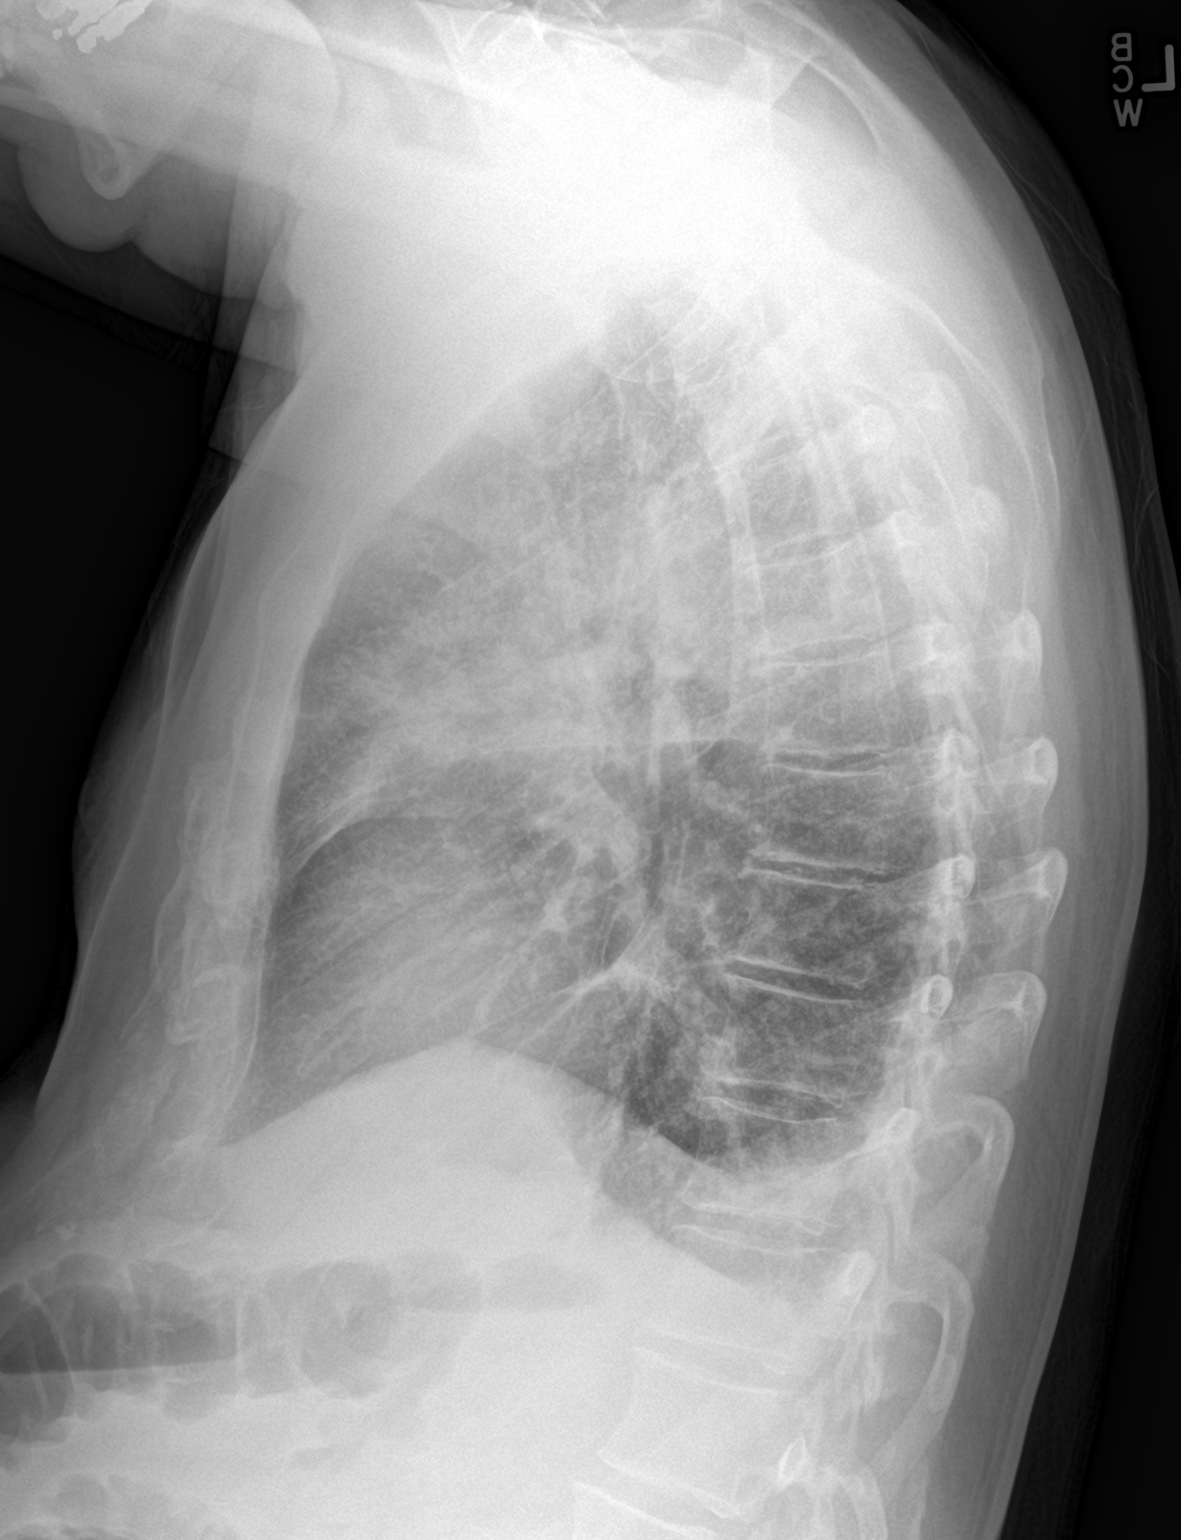

[2 of 2 positions shown; findings below may reference images not displayed]

FINDINGS: Heart size is normal. Dense opacity identified in the right upper
lobe. There are bilateral pleural effusions. Mild infiltrate
identified within the left lower lobe.
IMPRESSION: Persistent right upper lobe infiltrate.

Question early left lower lobe infiltrate.

## 2017-05-22 IMAGING — DX DG CHEST 2V
2 series · 2 of 2 positions shown · non-contrast
Comparison: Chest x-ray of 09/25/2016

CLINICAL DATA: History of pneumonia, followup

EXAM:
CHEST  2 VIEW

[chest pa]
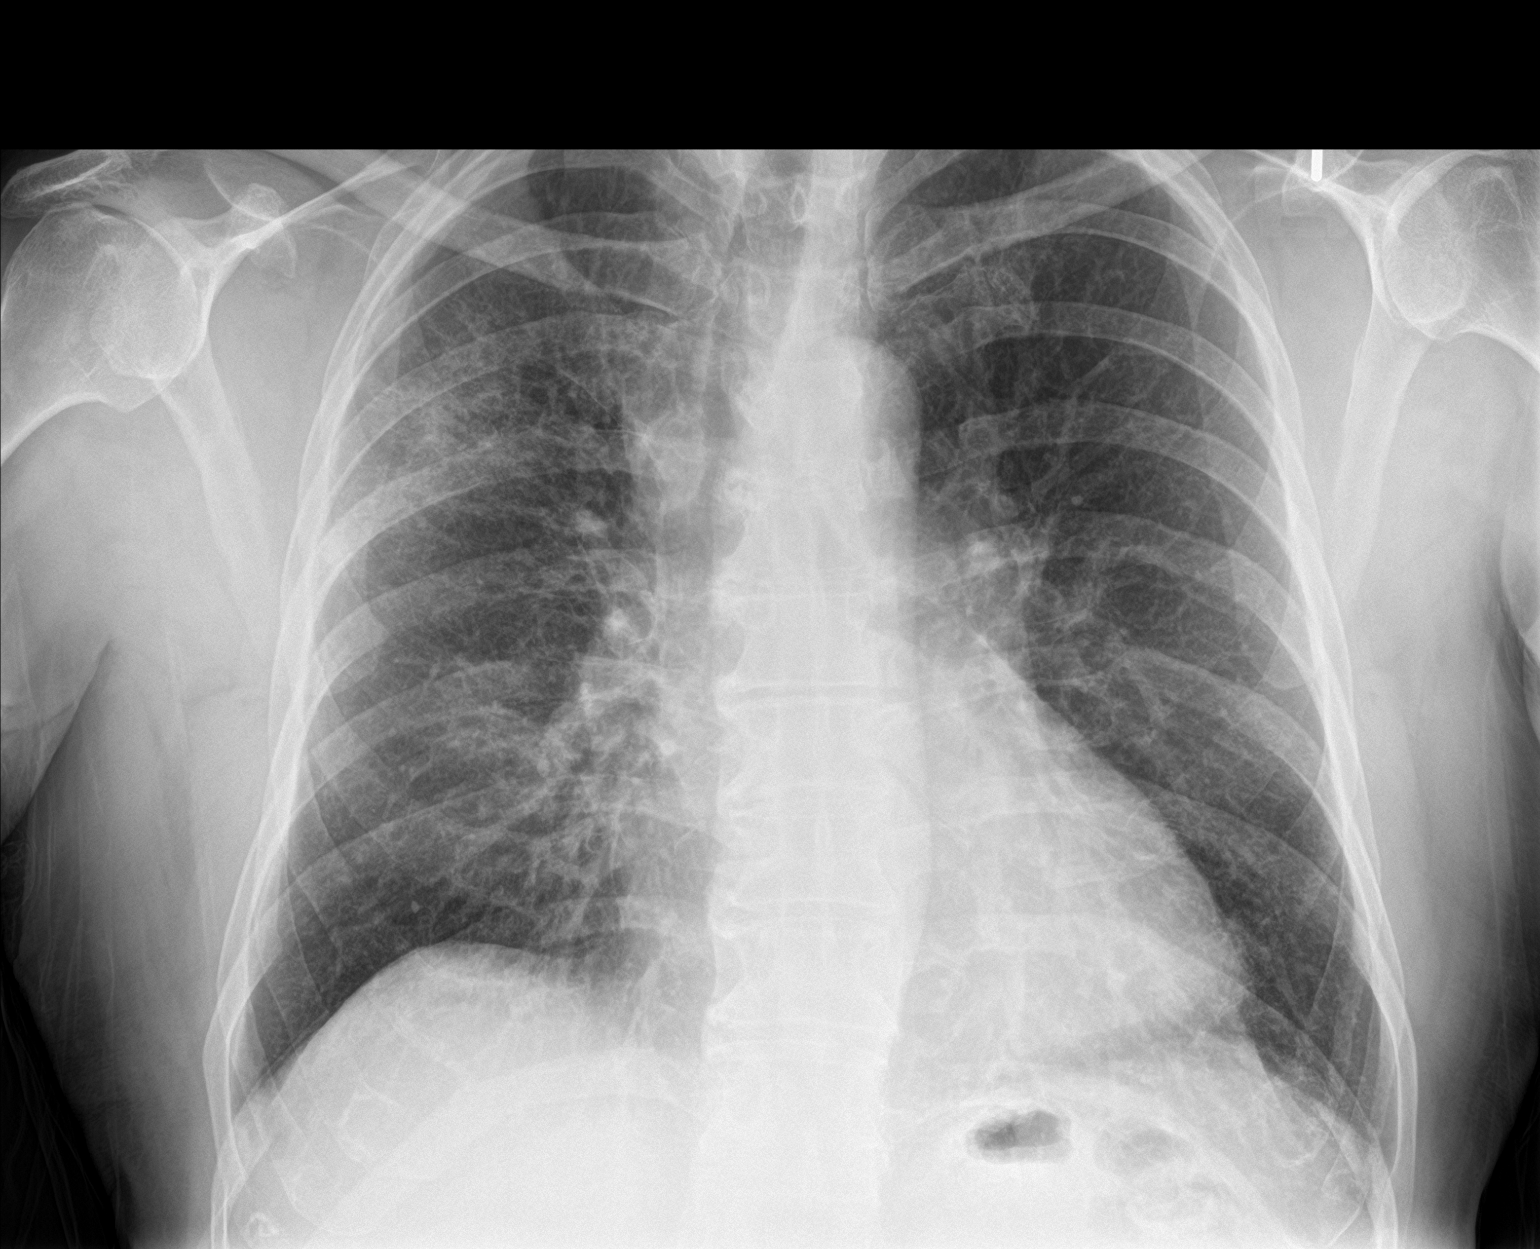

[chest lat]
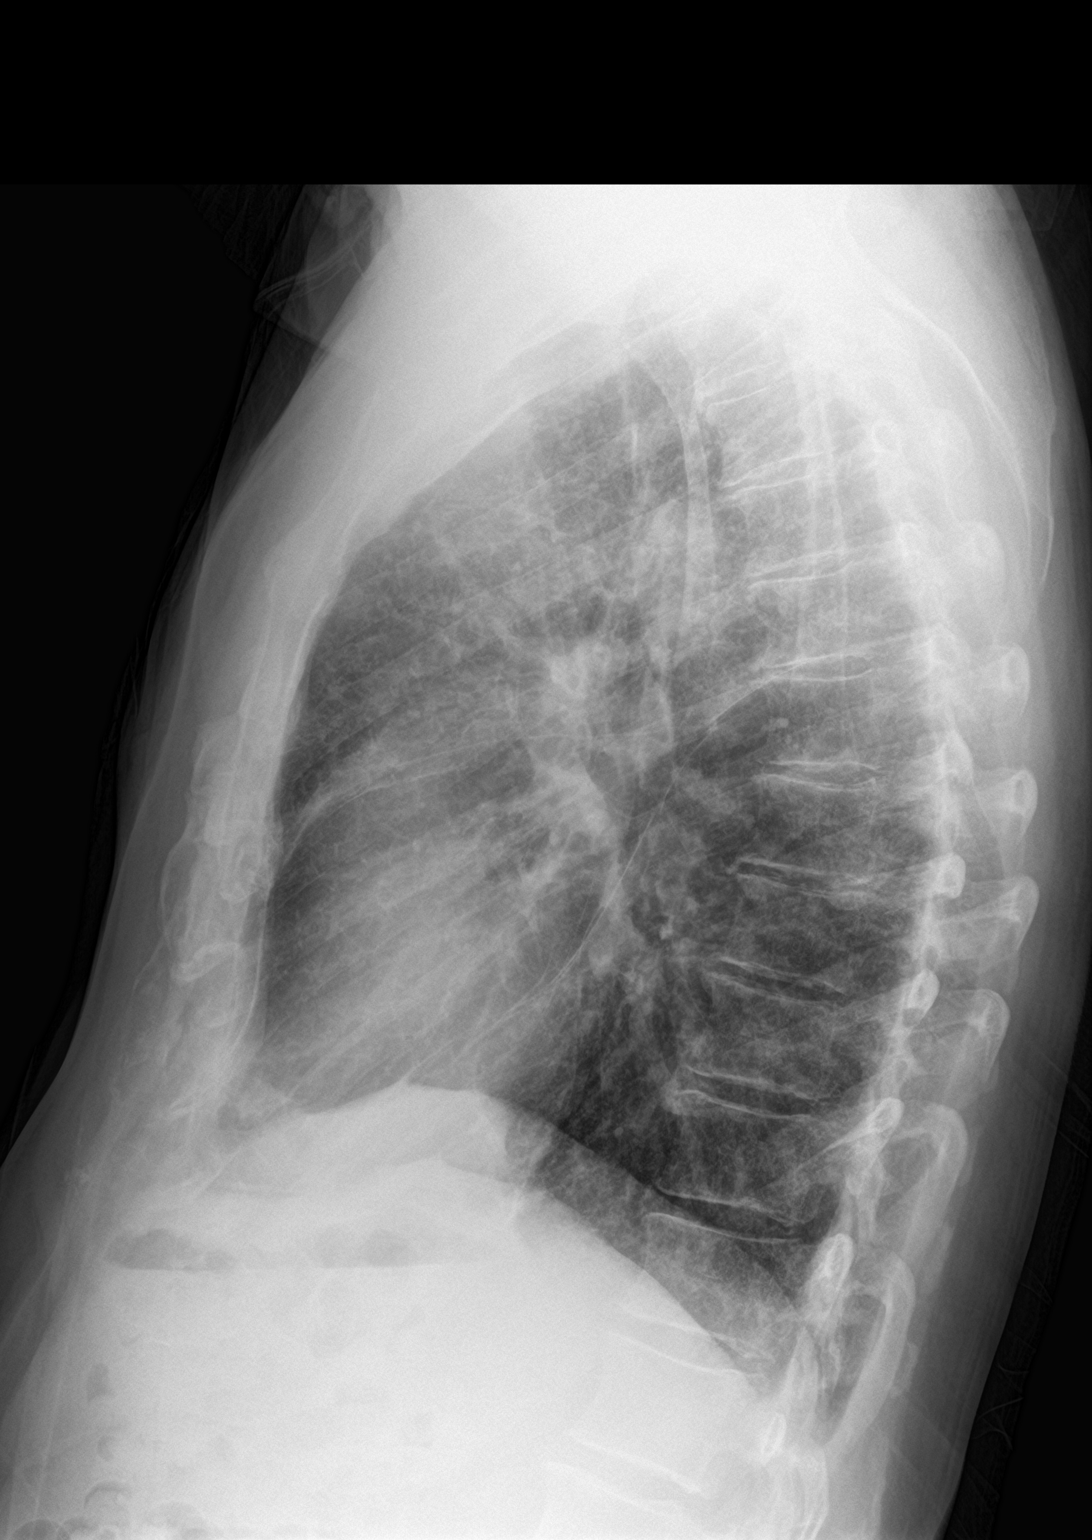

[2 of 2 positions shown; findings below may reference images not displayed]

FINDINGS: There has been incomplete clearing of the right upper lobe pneumonia
with minimal opacity remaining. The left lung is clear. No pleural
effusion is seen. Mediastinal and hilar contours are unchanged and
the heart is within upper limits normal. No acute bony abnormality
is seen.
IMPRESSION: Partial clearing of right upper lobe pneumonia with minimal opacity
remaining.
